# Patient Record
Sex: Female | Born: 1980 | Hispanic: Yes | Marital: Married | Smoking: Never smoker
Health system: Southern US, Community
[De-identification: ages and names within clinical notes are randomized; demographics above are authoritative.]

## PROBLEM LIST (undated history)

## (undated) DIAGNOSIS — F419 Anxiety disorder, unspecified: Secondary | ICD-10-CM

## (undated) DIAGNOSIS — T7840XA Allergy, unspecified, initial encounter: Secondary | ICD-10-CM

## (undated) DIAGNOSIS — R51 Headache: Secondary | ICD-10-CM

## (undated) DIAGNOSIS — R87619 Unspecified abnormal cytological findings in specimens from cervix uteri: Secondary | ICD-10-CM

## (undated) DIAGNOSIS — F329 Major depressive disorder, single episode, unspecified: Secondary | ICD-10-CM

## (undated) DIAGNOSIS — S3992XA Unspecified injury of lower back, initial encounter: Secondary | ICD-10-CM

## (undated) DIAGNOSIS — E162 Hypoglycemia, unspecified: Secondary | ICD-10-CM

## (undated) DIAGNOSIS — F32A Depression, unspecified: Secondary | ICD-10-CM

## (undated) DIAGNOSIS — S82891A Other fracture of right lower leg, initial encounter for closed fracture: Secondary | ICD-10-CM

## (undated) DIAGNOSIS — R002 Palpitations: Secondary | ICD-10-CM

## (undated) DIAGNOSIS — IMO0002 Reserved for concepts with insufficient information to code with codable children: Secondary | ICD-10-CM

## (undated) DIAGNOSIS — N83209 Unspecified ovarian cyst, unspecified side: Secondary | ICD-10-CM

## (undated) HISTORY — PX: TUBAL LIGATION: SHX77

## (undated) HISTORY — PX: LAPAROSCOPIC OVARIAN CYSTECTOMY: SUR786

## (undated) HISTORY — PX: ABSCESS DRAINAGE: SHX1119

## (undated) HISTORY — DX: Allergy, unspecified, initial encounter: T78.40XA

## (undated) HISTORY — PX: BREAST LUMPECTOMY: SHX2

---

## 2010-10-10 ENCOUNTER — Encounter (HOSPITAL_COMMUNITY): Payer: Self-pay | Admitting: *Deleted

## 2010-10-10 ENCOUNTER — Inpatient Hospital Stay (HOSPITAL_COMMUNITY): Payer: 59

## 2010-10-10 ENCOUNTER — Inpatient Hospital Stay (HOSPITAL_COMMUNITY)
Admission: AD | Admit: 2010-10-10 | Discharge: 2010-10-10 | Disposition: A | Discharge status: Home / Self Care | Payer: 59 | Source: Ambulatory Visit | Attending: Obstetrics and Gynecology | Admitting: Obstetrics and Gynecology

## 2010-10-10 DIAGNOSIS — R109 Unspecified abdominal pain: Secondary | ICD-10-CM

## 2010-10-10 HISTORY — DX: Palpitations: R00.2

## 2010-10-10 HISTORY — DX: Unspecified ovarian cyst, unspecified side: N83.209

## 2010-10-10 HISTORY — DX: Headache: R51

## 2010-10-10 HISTORY — DX: Hypoglycemia, unspecified: E16.2

## 2010-10-10 HISTORY — DX: Other fracture of right lower leg, initial encounter for closed fracture: S82.891A

## 2010-10-10 HISTORY — DX: Major depressive disorder, single episode, unspecified: F32.9

## 2010-10-10 HISTORY — DX: Reserved for concepts with insufficient information to code with codable children: IMO0002

## 2010-10-10 HISTORY — DX: Unspecified injury of lower back, initial encounter: S39.92XA

## 2010-10-10 HISTORY — DX: Unspecified abnormal cytological findings in specimens from cervix uteri: R87.619

## 2010-10-10 HISTORY — DX: Anxiety disorder, unspecified: F41.9

## 2010-10-10 HISTORY — DX: Depression, unspecified: F32.A

## 2010-10-10 LAB — URINALYSIS, ROUTINE W REFLEX MICROSCOPIC
Glucose, UA: NEGATIVE mg/dL
Leukocytes, UA: NEGATIVE
Nitrite: NEGATIVE
Protein, ur: NEGATIVE mg/dL
pH: 7 (ref 5.0–8.0)

## 2010-10-10 LAB — POCT PREGNANCY, URINE: Preg Test, Ur: NEGATIVE

## 2010-10-10 MED ORDER — IBUPROFEN 600 MG PO TABS: 600.0000 mg | ORAL_TABLET | Freq: Four times a day (QID) | ORAL | Status: AC | PRN

## 2010-10-10 NOTE — Progress Notes (Signed)
Pain started on Tues- RLQ, feels like abd is swelling,

## 2010-10-10 NOTE — ED Provider Notes (Signed)
Sydney Sullivan is a 30 y.o. [id number]  Chief Complaint  Patient presents with  . Abdominal Pain  She gives a two-day history of constant, sharp right suprapubic pain. She believes it is similar in quality to the pain she experienced when she had an ovarian cyst in the past. She is rating her pain 5/10 at present. She's had a BTL and 2 C-sections.  LMP 09/03/10. Denies menometrorrhagia, dysuria. Mild dysparunia.   Filed Vitals:   10/10/10 1231  BP: 111/65  Pulse: 73  Temp: 98.3 F (36.8 C)  Resp: 16   Exam:  General: Sydney N. WD pleasant in NAD  Abdomen: Soft, minimally tender to deep palpation right suprapubic region. No guarding or rebound. No masses noted.  Pelvic: NEFG except there is a buttonhole skin defect of right hymenal tissue. Vagina and cervix clean. Cervix mobile no tenderness elicited. Uterus NSS, retroverted. Adnexae without tenderness or masses.  Results for orders placed during the hospital encounter of 10/10/10 (from the past 24 hour(s))  URINALYSIS, ROUTINE W REFLEX MICROSCOPIC     Status: Abnormal   Collection Time   10/10/10 12:40 PM      Component Value Range   Color, Urine YELLOW  YELLOW    Appearance HAZY (*) CLEAR    Specific Gravity, Urine 1.020  1.005 - 1.030    pH 7.0  5.0 - 8.0    Glucose, UA NEGATIVE  NEGATIVE (mg/dL)   Hgb urine dipstick NEGATIVE  NEGATIVE    Bilirubin Urine NEGATIVE  NEGATIVE    Ketones, ur NEGATIVE  NEGATIVE (mg/dL)   Protein, ur NEGATIVE  NEGATIVE (mg/dL)   Urobilinogen, UA 0.2  0.0 - 1.0 (mg/dL)   Nitrite NEGATIVE  NEGATIVE    Leukocytes, UA NEGATIVE  NEGATIVE   POCT PREGNANCY, URINE     Status: Normal   Collection Time   10/10/10  1:02 PM      Component Value Range   Preg Test, Ur NEGATIVE    Pelvic US: unremarkable, sm CLC on rt  A/P Abdominal pain  - RX ibuprofen, keep pain diary. Refer to GYN Clinic for [facility] care.

## 2010-10-10 NOTE — ED Notes (Signed)
Plan discussed.  Waiting for [place]. Call light in reach. No c/o.

## 2010-10-10 NOTE — ED Notes (Signed)
Pt states late period is not unusual... Hx of irreg cycles.

## 2010-10-10 NOTE — Progress Notes (Signed)
Pt states she started having R lower abdominal pain on 8-14 that comes and goes. Pt Has a BTL with a cesarean section 10 years ago. No bleeding or discharge, nausea or vomiting.

## 2010-10-12 NOTE — ED Provider Notes (Signed)
Agree with above note.  Sydney Sullivan 10/12/2010 11:03 AM

## 2010-11-22 ENCOUNTER — Encounter: Payer: Self-pay | Admitting: Obstetrics and Gynecology

## 2010-11-22 ENCOUNTER — Ambulatory Visit (INDEPENDENT_AMBULATORY_CARE_PROVIDER_SITE_OTHER): Payer: No Typology Code available for payment source | Admitting: Obstetrics and Gynecology

## 2010-11-22 VITALS — BP 112/74 | HR 70 | Temp 98.2°F | Ht <= 58 in | Wt 122.8 lb

## 2010-11-22 DIAGNOSIS — R1011 Right upper quadrant pain: Secondary | ICD-10-CM

## 2010-11-22 LAB — LIPASE: Lipase: 11 U/L (ref 0–75)

## 2010-11-22 LAB — AMYLASE: Amylase: 80 U/L (ref 0–105)

## 2010-11-22 LAB — COMPREHENSIVE METABOLIC PANEL
ALT: 15 U/L (ref 0–35)
AST: 18 U/L (ref 0–37)
Alkaline Phosphatase: 53 U/L (ref 39–117)
Calcium: 9.7 mg/dL (ref 8.4–10.5)
Chloride: 102 mEq/L (ref 96–112)
Creat: 0.56 mg/dL (ref 0.50–1.10)

## 2010-11-22 LAB — CBC
Hemoglobin: 12.9 g/dL (ref 12.0–15.0)
MCHC: 31.3 g/dL (ref 30.0–36.0)
Platelets: 195 10*3/uL (ref 150–400)
RBC: 4.73 MIL/uL (ref 3.87–5.11)

## 2010-11-26 ENCOUNTER — Ambulatory Visit (HOSPITAL_COMMUNITY): Payer: No Typology Code available for payment source

## 2010-11-27 NOTE — Progress Notes (Signed)
S: Pt presents today c/o Rt side pain. She states the pain comes and goes and is located mostly in her Rt side and Rt upper quad. She denies vag dc, bleeding, fever, or any other sx at this time. O: VSS afebrile A&Ox3 in NAD Abd soft, mild tenderness in RUQ. No rebound or guarding. No tenderness to lower quads. NL external genitalia. Bimanual exam reveals uterus to be NL size and shape with no adnexal masses. No dc or bleeding noted on exam. A/P: RUQ pain: Will check CBC, amylase, lipase, LFTs, and scheduled abd ultrasound to r/o cholecystitis. Discussed with pt at length. She will return to the GYN clinic following her [place]. Discussed diet, activity, risks, and precautions. All information was obtained and explained via interpreter.  Clinton Gallant. Zeda Gangwer III, DrHSc, MPAS, PA-C

## 2010-12-06 ENCOUNTER — Ambulatory Visit (HOSPITAL_COMMUNITY)
Admission: RE | Admit: 2010-12-06 | Discharge: 2010-12-06 | Disposition: A | Discharge status: Home / Self Care | Payer: No Typology Code available for payment source | Source: Ambulatory Visit | Attending: Obstetrics and Gynecology | Admitting: Obstetrics and Gynecology

## 2010-12-06 DIAGNOSIS — R1011 Right upper quadrant pain: Secondary | ICD-10-CM | POA: Insufficient documentation

## 2010-12-20 ENCOUNTER — Encounter: Payer: Self-pay | Admitting: Obstetrics and Gynecology

## 2010-12-20 ENCOUNTER — Ambulatory Visit (INDEPENDENT_AMBULATORY_CARE_PROVIDER_SITE_OTHER): Payer: No Typology Code available for payment source | Admitting: Obstetrics and Gynecology

## 2010-12-20 VITALS — BP 121/73 | HR 80 | Temp 97.7°F | Ht <= 58 in | Wt 122.9 lb

## 2010-12-20 DIAGNOSIS — L658 Other specified nonscarring hair loss: Secondary | ICD-10-CM

## 2010-12-20 LAB — TSH: TSH: 0.75 u[IU]/mL (ref 0.350–4.500)

## 2010-12-20 NOTE — Progress Notes (Signed)
Patient is a 30 year old Spanish-speaking Hispanic female gravida 2 para 200 to missing 2 weeks ago because of upper right quadrant pain that came frequently lasted for a few minutes and then went away she was tested for liver function tests ultrasound and CT for possible cholecystitis and amylase and lipase all of her testing was normal abdominal CT as well as a pelvic ultrasound. I've told her to start stool softeners and she only goes every 3 days started 4 times a day and will she's going more than a couple times a day to cut it back to 3 to one time a day micturition can lead 8 glasses of fluid each day see that removed recently as the pain if it doesn't we'll refer her to a gastroenterologist.  Impression: Obstipation possible IBS. Recent hair loss.

## 2010-12-20 NOTE — Progress Notes (Signed)
Pt already has had flu vaccine

## 2011-11-17 ENCOUNTER — Ambulatory Visit (INDEPENDENT_AMBULATORY_CARE_PROVIDER_SITE_OTHER): Payer: 59 | Admitting: Obstetrics & Gynecology

## 2011-11-17 ENCOUNTER — Encounter: Payer: Self-pay | Admitting: Obstetrics & Gynecology

## 2011-11-17 VITALS — BP 110/70 | HR 78 | Temp 98.6°F | Ht <= 58 in | Wt 138.0 lb

## 2011-11-17 DIAGNOSIS — N898 Other specified noninflammatory disorders of vagina: Secondary | ICD-10-CM

## 2011-11-17 DIAGNOSIS — Z0000 Encounter for general adult medical examination without abnormal findings: Secondary | ICD-10-CM

## 2011-11-17 DIAGNOSIS — Z01419 Encounter for gynecological examination (general) (routine) without abnormal findings: Secondary | ICD-10-CM

## 2011-11-17 NOTE — Progress Notes (Signed)
Subjective:    Sydney Sullivan is a 31 y.o. female who presents for an annual exam. She continues to have her RLQ pain. It is unchanged and daily for the last 2-3 years. It is worse and wakes her up at night when she changes position during sleep. The patient is sexually active. GYN screening history: last pap: was normal in [place].The patient wears seatbelts: yes. The patient participates in regular exercise: yes. Has the patient ever been transfused or tattooed?: no. The patient reports that there is not domestic violence in her life.   Menstrual History: OB History    Grav Para Term Preterm Abortions TAB SAB Ect Mult Living   2 2 2  0 0 0 0 0 0 2      Menarche age: 72 Patient's last menstrual period was 09/29/2011.    The following portions of the patient's history were reviewed and updated as appropriate: allergies, current medications, past family history, past medical history, past social history, past surgical history and problem list.  Review of Systems A comprehensive review of systems was negative. She is married, homemaker. She reports that she has a BM daily.   Objective:    BP 110/70  Pulse 78  Temp 98.6 F (37 C) (Oral)  Ht 4\' 10"  (1.473 m)  Wt 138 lb (62.596 kg)  BMI 28.84 kg/m2  LMP 09/29/2011  General Appearance:    Alert, cooperative, no distress, appears stated age  Head:    Normocephalic, without obvious abnormality, atraumatic  Eyes:    PERRL, conjunctiva/corneas clear, EOM's intact, fundi    benign, both eyes  Ears:    Normal TM's and external ear canals, both ears  Nose:   Nares normal, septum midline, mucosa normal, no drainage    or sinus tenderness  Throat:   Lips, mucosa, and tongue normal; teeth and gums normal  Neck:   Supple, symmetrical, trachea midline, no adenopathy;    thyroid:  no enlargement/tenderness/nodules; no carotid   bruit or JVD  Back:     Symmetric, no curvature, ROM normal, no CVA tenderness  Lungs:     Clear to  auscultation bilaterally, respirations unlabored  Chest Wall:    No tenderness or deformity   Heart:    Regular rate and rhythm, S1 and S2 normal, no murmur, rub   or gallop  Breast Exam:    No tenderness, masses, or nipple abnormality  Abdomen:     Soft, non-tender, bowel sounds active all four quadrants,    no masses, no organomegaly  Genitalia:    Normal female without lesion, discharge or tenderness, normal discharge, normal cervix, no CMT. NSSA, NT, no adnexal masses. I did feel a hard BM in her vault.     Extremities:   Extremities normal, atraumatic, no cyanosis or edema  Pulses:   2+ and symmetric all extremities  Skin:   Skin color, texture, turgor normal, no rashes or lesions  Lymph nodes:   Cervical, supraclavicular, and axillary nodes normal  Neurologic:   CNII-XII intact, normal strength, sensation and reflexes    throughout  .    Assessment:    Healthy female exam.  Long standing RLQ pain. Not gyn etiology. I am unsure what the cause is. I have offered her a general surgery consult.   Plan:     Pap smear. Wet prep.

## 2011-11-18 LAB — WET PREP, GENITAL
Clue Cells Wet Prep HPF POC: NONE SEEN
Trich, Wet Prep: NONE SEEN
Yeast Wet Prep HPF POC: NONE SEEN

## 2012-04-10 ENCOUNTER — Other Ambulatory Visit: Payer: Self-pay

## 2012-12-30 ENCOUNTER — Other Ambulatory Visit: Payer: Self-pay

## 2013-01-24 ENCOUNTER — Ambulatory Visit: Payer: 59 | Admitting: Medical

## 2013-08-04 ENCOUNTER — Ambulatory Visit (INDEPENDENT_AMBULATORY_CARE_PROVIDER_SITE_OTHER): Payer: No Typology Code available for payment source | Admitting: Obstetrics & Gynecology

## 2013-08-04 ENCOUNTER — Encounter: Payer: Self-pay | Admitting: Obstetrics & Gynecology

## 2013-08-04 VITALS — BP 144/79 | HR 93 | Ht <= 58 in | Wt 133.9 lb

## 2013-08-04 DIAGNOSIS — N939 Abnormal uterine and vaginal bleeding, unspecified: Secondary | ICD-10-CM

## 2013-08-04 DIAGNOSIS — N926 Irregular menstruation, unspecified: Secondary | ICD-10-CM

## 2013-08-04 DIAGNOSIS — N949 Unspecified condition associated with female genital organs and menstrual cycle: Secondary | ICD-10-CM

## 2013-08-04 DIAGNOSIS — G8929 Other chronic pain: Secondary | ICD-10-CM

## 2013-08-04 DIAGNOSIS — R102 Pelvic and perineal pain: Principal | ICD-10-CM

## 2013-08-04 LAB — CBC
HCT: 39.1 % (ref 36.0–46.0)
Hemoglobin: 12.9 g/dL (ref 12.0–15.0)
MCH: 27.4 pg (ref 26.0–34.0)
MCHC: 33 g/dL (ref 30.0–36.0)
MCV: 83.2 fL (ref 78.0–100.0)
PLATELETS: 249 10*3/uL (ref 150–400)
RBC: 4.7 MIL/uL (ref 3.87–5.11)
RDW: 14.2 % (ref 11.5–15.5)
WBC: 8.7 10*3/uL (ref 4.0–10.5)

## 2013-08-04 MED ORDER — NAPROXEN 500 MG PO TABS: 500.0000 mg | ORAL_TABLET | Freq: Two times a day (BID) | ORAL | Status: DC

## 2013-08-04 NOTE — Progress Notes (Signed)
Patient reports that she had a lot of bleeding back in September and passed out. EMS was called and she refused to go to hospital. Since that time she is experiencing very bad cramps with and without her period.

## 2013-08-04 NOTE — Progress Notes (Signed)
   CLINIC ENCOUNTER NOTE  History:  33 y.o. [id number] here today for evaluation of pelvic pain and irregular bleeding. Patient is Spanish-speaking only, Spanish interpreter present for this encounter. Had bleeding episode in September, did not get evaluated.  Pain is not related to her menses; happens during and also not during menses.  Had BTL in 2001, pain feels like "she is having a baby". Also reports dyspareunia. Also notes abnormal vaginal discharge.  The following portions of the patient's history were reviewed and updated as appropriate: allergies, current medications, past family history, past medical history, past social history, past surgical history and problem list.  Review of Systems:  Pertinent items are noted in HPI.  Objective:  Physical Exam BP 144/79  Pulse 93  Ht 4\' 10"  (1.473 m)  Wt 133 lb 14.4 oz (60.737 kg)  BMI 27.99 kg/m2  LMP 07/22/2013 Gen: NAD Abd: Soft, nontender and nondistended Pelvic: Normal appearing external genitalia; normal appearing vaginal mucosa and cervix.  Normal discharge.  Small uterus, no other palpable masses, mild uterine and left adnexal tenderness. Pelvic cultures done.   Assessment & Plan:  Will get pelvic ultrasound; AUB labs checked NSAIDs recommended - Naproxen prescribed Will follow up results and manage accordingly    Verita Schneiders, MD, FACOG Attending [place], [facility]

## 2013-08-04 NOTE — Patient Instructions (Signed)
Return to clinic for any scheduled appointments or for any gynecologic concerns as needed.   

## 2013-08-05 LAB — TESTOSTERONE, FREE, TOTAL, SHBG
Sex Hormone Binding: 40 nmol/L (ref 18–114)
Testosterone, Free: 10.6 pg/mL — ABNORMAL HIGH (ref 0.6–6.8)
Testosterone-% Free: 1.6 % (ref 0.4–2.4)
Testosterone: 66 ng/dL (ref 10–70)

## 2013-08-05 LAB — GC/CHLAMYDIA PROBE AMP
CT PROBE, AMP APTIMA: NEGATIVE
GC PROBE AMP APTIMA: NEGATIVE

## 2013-08-05 LAB — WET PREP, GENITAL
CLUE CELLS WET PREP: NONE SEEN
TRICH WET PREP: NONE SEEN
WBC, Wet Prep HPF POC: NONE SEEN
Yeast Wet Prep HPF POC: NONE SEEN

## 2013-08-05 LAB — TSH: TSH: 0.595 u[IU]/mL (ref 0.350–4.500)

## 2013-08-05 LAB — FOLLICLE STIMULATING HORMONE: FSH: 6.2 m[IU]/mL

## 2013-08-08 ENCOUNTER — Ambulatory Visit (HOSPITAL_COMMUNITY)
Admission: RE | Admit: 2013-08-08 | Discharge: 2013-08-08 | Disposition: A | Discharge status: Home / Self Care | Payer: No Typology Code available for payment source | Source: Ambulatory Visit | Attending: Obstetrics & Gynecology | Admitting: Obstetrics & Gynecology

## 2013-08-08 DIAGNOSIS — G8929 Other chronic pain: Secondary | ICD-10-CM

## 2013-08-08 DIAGNOSIS — N938 Other specified abnormal uterine and vaginal bleeding: Secondary | ICD-10-CM | POA: Insufficient documentation

## 2013-08-08 DIAGNOSIS — N939 Abnormal uterine and vaginal bleeding, unspecified: Secondary | ICD-10-CM

## 2013-08-08 DIAGNOSIS — R102 Pelvic and perineal pain: Secondary | ICD-10-CM

## 2013-08-08 DIAGNOSIS — N949 Unspecified condition associated with female genital organs and menstrual cycle: Secondary | ICD-10-CM | POA: Insufficient documentation

## 2013-08-08 DIAGNOSIS — N925 Other specified irregular menstruation: Secondary | ICD-10-CM | POA: Insufficient documentation

## 2013-08-10 ENCOUNTER — Telehealth: Payer: Self-pay | Admitting: *Deleted

## 2013-08-10 ENCOUNTER — Encounter: Payer: Self-pay | Admitting: *Deleted

## 2013-08-10 NOTE — Telephone Encounter (Signed)
Attempted to contact patient, both numbers are invalid, will send certified letter.

## 2013-08-10 NOTE — Telephone Encounter (Signed)
Message copied by Sue Lush on Wed Aug 10, 2013  4:13 PM ------      Message from: Verita Schneiders A      Created: Tue Aug 09, 2013 12:47 PM       Normal ultrasound. If bleeding continues, may need endometrial biopsy. She needs appointment to discuss long term management options. Please call to inform patient of results and recommendations. Patient is Spanish-speaking only. ------

## 2013-09-08 ENCOUNTER — Encounter: Payer: No Typology Code available for payment source | Admitting: Advanced Practice Midwife

## 2013-09-15 ENCOUNTER — Ambulatory Visit (INDEPENDENT_AMBULATORY_CARE_PROVIDER_SITE_OTHER): Payer: No Typology Code available for payment source | Admitting: Obstetrics & Gynecology

## 2013-09-15 ENCOUNTER — Encounter: Payer: Self-pay | Admitting: Obstetrics & Gynecology

## 2013-09-15 VITALS — BP 118/83 | HR 79 | Temp 98.2°F | Wt 135.9 lb

## 2013-09-15 DIAGNOSIS — N946 Dysmenorrhea, unspecified: Secondary | ICD-10-CM

## 2013-09-15 DIAGNOSIS — R102 Pelvic and perineal pain: Secondary | ICD-10-CM

## 2013-09-15 DIAGNOSIS — N949 Unspecified condition associated with female genital organs and menstrual cycle: Secondary | ICD-10-CM

## 2013-09-15 MED ORDER — MEDROXYPROGESTERONE ACETATE 5 MG PO TABS: 5.0000 mg | ORAL_TABLET | Freq: Every day | ORAL | Status: DC

## 2013-09-15 NOTE — Progress Notes (Signed)
Subjective:     Patient ID: Sydney Sullivan, female   DOB: 15-Feb-1981, 33 y.o.   MRN: [id number]  HPIreturns for result of [place] and to discuss management of pelvic pain and dysmenorrhea.   Review of Systems  Constitutional: Negative.   Gastrointestinal: Positive for nausea and vomiting.  Genitourinary: Positive for menstrual problem (dysmenorrhea) and pelvic pain.       Objective:   Physical Exam  Constitutional: She is oriented to person, place, and time. She appears well-developed. No distress.  Pulmonary/Chest: Effort normal. No respiratory distress.  Neurological: She is alert and oriented to person, place, and time.  Psychiatric: She has a normal mood and affect. Her behavior is normal.      CLINICAL DATA: Abnormal uterine bleeding. Pelvic pain.  EXAM:  TRANSABDOMINAL AND TRANSVAGINAL ULTRASOUND OF PELVIS  TECHNIQUE:  Both transabdominal and transvaginal ultrasound examinations of the  pelvis were performed. Transabdominal technique was performed for  global imaging of the pelvis including uterus, ovaries, adnexal  regions, and pelvic cul-de-sac. It was necessary to proceed with  endovaginal exam following the transabdominal exam to visualize the  uterus and endometrium.  COMPARISON: None  FINDINGS:  Uterus  Measurements: 7.1 x 4.9 x 5.2 cm. No fibroids or other mass  visualized.  Endometrium  Thickness: 17.5 mm. No focal abnormality visualized.  Right ovary  Measurements: 3.4 x 1.9 x 2.5 cm. Normal appearance/no adnexal mass.  Left ovary  Measurements: 2.3 x 1.8 x 1.5 cm. Normal appearance/no adnexal  mass.  Other findings  Small amount of free fluid noted in the pelvis.  IMPRESSION:  1. No focal abnormality identified. The endometrium measures 17.5 mm  in thickness. If bleeding remains unresponsive to hormonal or  medical therapy, focal lesion work-up with sonohysterogram should be  considered. Endometrial biopsy should also be considered in   pre-menopausal patients at high risk for endometrial carcinoma.  (Ref: Radiological Reasoning: Algorithmic Workup of Abnormal Vaginal  Bleeding with Endovaginal Sonography and Sonohysterography. AJR  2008; 144:[id number])  2. Small amount of free fluid is noted within the pelvis.  Electronically Signed  By: Kerby Moors M.D.  On: 08/08/2013 16:05   Assessment:     Pelvic pain     Plan:     Provera 5 mg daily, RTC 3 months, might be candidate for Dx L/S   Woodroe Mode, MD 09/15/2013

## 2013-09-15 NOTE — Patient Instructions (Signed)
Dolor pélvico  °(Pelvic Pain) ° Las causa del dolor pélvico en la mujer pueden ser muchas y pueden tener su origen en diferentes lugares. El dolor pélvico es el que aparece en la mitad inferior del abdomen y entre las caderas. Puede aparecer durante en un período corto de tiempo (agudo)o puede ser recurrente (crónico). Esta afección puede estar relacionada con trastornos que afectan a los órganos reproductivos femeninos (ginecológica), pero también puede deberse a problemas en la vejiga, cálculos renales, complicaciones intestinales, o problemas musculares o esqueléticos. Es importante solicitar ayuda de inmediato, sobre todo si ha sido intenso, agudo, o ha aparecido de manera súbita como un dolor inusual. También es importante obtener ayuda de inmediato, ya que algunos tipos de dolor pélvico puede poner en peligro la vida.  °CAUSAS  °A continuación veremos algunas de las causas del dolor pélvico. Las causas pueden clasificarse de diferentes modos.  °· Ginecológica. °¨ Enfermedad inflamatoria pélvica. °¨ Infecciones de transmisión sexual. °¨ Quiste de ovario o torsión de un ligamento ovárico ( torsión ovárica). °¨ La membrana que recubre internamente al útero desarrollándose fuera del útero (endometriosis). °¨ Fibromas, quistes o tumores. °¨ Ovulación. °· Embarazo. °¨ Embarazo fuera del útero (embarazo ectópico). °¨ Aborto espontáneo. °¨ Trabajo de parto. °¨ Desprendimiento de la placenta o ruptura del útero. °· Infecciones. °¨ Infección uterina (endometritis). °¨ Infección de la vejiga. °¨ Diverticulitis. °¨ Aborto relacionado con una infección uterina (aborto séptico). °· Vejiga. °¨ Inflamación de la vejiga (cistitis). °¨ Cálculos renales. °· Gastrointenstinal. °¨ Estreñimiento. °¨ Diverticulitis. °· Neurológico. °¨ Traumatismos. °¨ Sentir dolor pélvico debido a causas mentales o emocionales (psicosomático). °· Tumores en el intestino o en la pelvis. °EVALUACIÓN  °El médico hará una historia clínica detallada  según sus síntomas. Incluirá los cambios recientes en su salud, una cuidadosa historia ginecológica de sus periodos (menstruaciones) y una historia de su actividad sexual. Los antecedentes familiares y la historia clínica también son importantes. Su médico podrá indicar un examen pélvico. El examen pélvico ayudará a identificar la ubicación y la gravedad del dolor. También ayudará a evaluar los órganos que pueden estar involucrados. . Para identificar la causa del dolor pélvico y tratarlo adecuadamente, el médico puede indicar estudios. Estas pruebas pueden ser:  °· Test de embarazo. °· Ecografía pélvica. °· Radiografía del abdomen. °· Un análisis de orina o la evaluación de la secreción vaginal. °· Análisis de sangre. °INSTRUCCIONES PARA EL CUIDADO EN EL HOGAR  °· Solo tome medicamentos de venta libre o recetados para el dolor, malestar o fiebre, según las indicaciones del médico.   °· Haga reposo según las indicaciones del médico.   °· Consuma una dieta balanceada.   °· Beba gran cantidad de líquido para mantener la orina de tono claro o amarillo pálido.   °· Evite las relaciones sexuales, si le producen dolor.   °· Aplique compresas calientes o frías en la zona baja del abdomen según cual le calme el dolor.   °· Evite las situaciones estresantes.   °· Lleve un registro del dolor pélvico. Anote cuándo comenzó, dónde se localiza el dolor y si hay cosas que parecen estar asociadas con el dolor, como algún alimento o su ciclo menstrual. °· Concurra a las visitas de control con el médico, según las indicaciones.   °SOLICITE ATENCIÓN MÉDICA SI:  °· Los medicamentos no le calman el dolor. °· Tiene flujo vaginal anormal. °SOLICITE ATENCIÓN MÉDICA DE INMEDIATO SI:  °· Tiene un sangrado abundante por la vagina.   °· El dolor pélvico aumenta.   °· Se siente mareada o sufre un desmayo.   °·   Siente escalofríos.   °· Siente dolor intenso al orinar u observa sangre en la orina.   °· Tiene diarrea o vómitos que no puede  controlar.   °· Tiene fiebre o síntomas que persisten durante más de 3 días. °· Tiene fiebre y los síntomas empeoran.   °· Ha sido abusada física o sexualmente.   °ASEGÚRESE DE QUE:  °· Comprende estas instrucciones. °· Controlará su enfermedad. °· Solicitará ayuda de inmediato si no mejora o si empeora. °Document Released: 05/09/2008 Document Revised: 06/27/2013 °ExitCare® Patient Information ©2015 [employer]. This information is not intended to replace advice given to you by your health care provider. Make sure you discuss any questions you have with your health care provider. ° °

## 2013-10-07 ENCOUNTER — Encounter: Payer: Self-pay | Admitting: General Practice

## 2013-10-14 ENCOUNTER — Encounter: Payer: Self-pay | Admitting: General Practice

## 2013-12-26 ENCOUNTER — Encounter: Payer: Self-pay | Admitting: Obstetrics & Gynecology

## 2014-07-25 ENCOUNTER — Emergency Department (HOSPITAL_COMMUNITY)
Admission: EM | Admit: 2014-07-25 | Discharge: 2014-07-25 | Disposition: A | Discharge status: Home / Self Care | Payer: Commercial Indemnity | Attending: Emergency Medicine | Admitting: Emergency Medicine

## 2014-07-25 ENCOUNTER — Emergency Department (HOSPITAL_COMMUNITY): Payer: Commercial Indemnity

## 2014-07-25 ENCOUNTER — Encounter (HOSPITAL_COMMUNITY): Payer: Self-pay | Admitting: Physical Medicine and Rehabilitation

## 2014-07-25 DIAGNOSIS — Z8781 Personal history of (healed) traumatic fracture: Secondary | ICD-10-CM | POA: Insufficient documentation

## 2014-07-25 DIAGNOSIS — Z8639 Personal history of other endocrine, nutritional and metabolic disease: Secondary | ICD-10-CM | POA: Insufficient documentation

## 2014-07-25 DIAGNOSIS — R55 Syncope and collapse: Secondary | ICD-10-CM | POA: Insufficient documentation

## 2014-07-25 DIAGNOSIS — J4521 Mild intermittent asthma with (acute) exacerbation: Secondary | ICD-10-CM | POA: Insufficient documentation

## 2014-07-25 DIAGNOSIS — R0602 Shortness of breath: Secondary | ICD-10-CM | POA: Diagnosis present

## 2014-07-25 DIAGNOSIS — M6281 Muscle weakness (generalized): Secondary | ICD-10-CM | POA: Insufficient documentation

## 2014-07-25 DIAGNOSIS — Z793 Long term (current) use of hormonal contraceptives: Secondary | ICD-10-CM | POA: Diagnosis not present

## 2014-07-25 DIAGNOSIS — R42 Dizziness and giddiness: Secondary | ICD-10-CM | POA: Diagnosis not present

## 2014-07-25 DIAGNOSIS — R51 Headache: Secondary | ICD-10-CM | POA: Insufficient documentation

## 2014-07-25 DIAGNOSIS — Z7952 Long term (current) use of systemic steroids: Secondary | ICD-10-CM | POA: Diagnosis not present

## 2014-07-25 DIAGNOSIS — Z8659 Personal history of other mental and behavioral disorders: Secondary | ICD-10-CM | POA: Insufficient documentation

## 2014-07-25 DIAGNOSIS — Z87828 Personal history of other (healed) physical injury and trauma: Secondary | ICD-10-CM | POA: Diagnosis not present

## 2014-07-25 DIAGNOSIS — Z8742 Personal history of other diseases of the female genital tract: Secondary | ICD-10-CM | POA: Diagnosis not present

## 2014-07-25 DIAGNOSIS — J40 Bronchitis, not specified as acute or chronic: Secondary | ICD-10-CM

## 2014-07-25 DIAGNOSIS — R531 Weakness: Secondary | ICD-10-CM

## 2014-07-25 DIAGNOSIS — J4520 Mild intermittent asthma, uncomplicated: Secondary | ICD-10-CM

## 2014-07-25 LAB — CBC WITH DIFFERENTIAL/PLATELET
BASOS ABS: 0.1 10*3/uL (ref 0.0–0.1)
Basophils Relative: 0 % (ref 0–1)
EOS PCT: 0 % (ref 0–5)
Eosinophils Absolute: 0 10*3/uL (ref 0.0–0.7)
HEMATOCRIT: 39.1 % (ref 36.0–46.0)
HEMOGLOBIN: 12.6 g/dL (ref 12.0–15.0)
Lymphocytes Relative: 27 % (ref 12–46)
Lymphs Abs: 3.4 10*3/uL (ref 0.7–4.0)
MCH: 26.8 pg (ref 26.0–34.0)
MCHC: 32.2 g/dL (ref 30.0–36.0)
MCV: 83 fL (ref 78.0–100.0)
MONO ABS: 0.8 10*3/uL (ref 0.1–1.0)
MONOS PCT: 7 % (ref 3–12)
NEUTROS ABS: 8.2 10*3/uL — AB (ref 1.7–7.7)
Neutrophils Relative %: 66 % (ref 43–77)
Platelets: 349 10*3/uL (ref 150–400)
RBC: 4.71 MIL/uL (ref 3.87–5.11)
RDW: 14 % (ref 11.5–15.5)
WBC: 12.5 10*3/uL — ABNORMAL HIGH (ref 4.0–10.5)

## 2014-07-25 LAB — COMPREHENSIVE METABOLIC PANEL
ALK PHOS: 64 U/L (ref 38–126)
ALT: 21 U/L (ref 14–54)
ANION GAP: 10 (ref 5–15)
AST: 20 U/L (ref 15–41)
Albumin: 3.8 g/dL (ref 3.5–5.0)
BILIRUBIN TOTAL: 0.4 mg/dL (ref 0.3–1.2)
BUN: 12 mg/dL (ref 6–20)
CO2: 26 mmol/L (ref 22–32)
Calcium: 9.3 mg/dL (ref 8.9–10.3)
Chloride: 102 mmol/L (ref 101–111)
Creatinine, Ser: 0.73 mg/dL (ref 0.44–1.00)
GFR calc Af Amer: >60 mL/min (ref >60)
GFR calc non Af Amer: >60 mL/min (ref >60)
Glucose, Bld: 105 mg/dL — ABNORMAL HIGH (ref 65–99)
POTASSIUM: 3.7 mmol/L (ref 3.5–5.1)
Sodium: 138 mmol/L (ref 135–145)
TOTAL PROTEIN: 7.4 g/dL (ref 6.5–8.1)

## 2014-07-25 LAB — I-STAT TROPONIN, ED: TROPONIN I, POC: 0.07 ng/mL (ref 0.00–0.08)

## 2014-07-25 LAB — D-DIMER, QUANTITATIVE: D-Dimer, Quant: 0.38 ug/mL-FEU (ref 0.00–0.48)

## 2014-07-25 MED ORDER — METHYLPREDNISOLONE SODIUM SUCC 125 MG IJ SOLR
125.0000 mg | Freq: Once | INTRAMUSCULAR | Status: AC
  Administered 2014-07-25: 125 mg via INTRAVENOUS
  Filled 2014-07-25: qty 2

## 2014-07-25 MED ORDER — HYDROCODONE-ACETAMINOPHEN 5-325 MG PO TABS
1.0000 | ORAL_TABLET | Freq: Once | ORAL | Status: AC
  Administered 2014-07-25: 1 via ORAL
  Filled 2014-07-25: qty 1

## 2014-07-25 MED ORDER — SODIUM CHLORIDE 0.9 % IV BOLUS (SEPSIS)
1000.0000 mL | Freq: Once | INTRAVENOUS | Status: AC
  Administered 2014-07-25: 1000 mL via INTRAVENOUS

## 2014-07-25 NOTE — ED Notes (Signed)
Pt presents to department for evaluation of midsternal chest pain, SOB, and headache. Was recently diagnosed with bronchitis, placed on antibiotic, pt thinks this could be reaction to medication. Respirations unlabored. NAD.

## 2014-07-25 NOTE — Discharge Instructions (Signed)
Asma (Asthma) El asma es una enfermedad recurrente en la que las vas respiratorias se estrechan y [place]. Puede causar dificultad para respirar. Provoca tos, sibilancias y sensacin de falta de aire. Los episodios de asma, tambin llamados crisis de asma, pueden ser leves o potencialmente mortales. El asma no puede curarse, pero los [employer] y los cambios en el estilo de vida lo ayudarn a Aeronautical engineer enfermedad. CAUSAS Se cree que la causa del asma son factores hereditarios (genticos) y la exposicin a factores ambientales; sin embargo, su causa exacta se desconoce. El asma generalmente es desencadenada por alrgenos, infecciones en los pulmones o sustancias irritantes que se encuentran en el aire. Los desencadenantes del asma son diferentes para cada persona. Los factores desencadenantes comunes incluyen:   Caspa de los [place].  caros del polvo.  [place].  El polen de los rboles o el csped.  [place].  Humo.  Sustancias contaminantes como el polvo, limpiadores del hogar, sprays para el cabello, aerosoles, vapores de pintura, sustancias qumicas fuertes u olores intensos.  El [place] fro, los cambios de Scientist, forensic y el viento (que aumenta la cantidad de moho y polen en el aire).  Emociones intensas, como llorar o rer [employer].  Estrs.  Ciertos medicamentos (como la aspirina) o algunos frmacos (como los betabloqueantes).  Los sulfitos que contienen los alimentos y las bebidas. Los alimentos y bebidas que pueden contener sulfitos son las frutas desecadas, las papas fritas y los vinos espumantes.  Enfermedades infecciosas o inflamatorias, como la gripe, el resfro o la inflamacin de las membranas nasales (rinitis).  El reflujo gastroesofgico (ERGE).  Los ejercicios o actividades extenuantes. SNTOMAS Los sntomas pueden ocurrir inmediatamente despus de que se desencadena el asma o muchas horas ms tarde. [place] sntomas son:  Sibilancias.  Tos excesiva  durante la noche o temprano por la maana.  Tos frecuente o intensa durante un resfro comn.  Opresin en el pecho.  Falta de aire. DIAGNSTICO  El diagnstico se realiza mediante la revisin de su historia clnica y de un examen fsico. Es posible que le indiquen algunos estudios. Estos pueden incluir:  Estudios de la funcin pulmonar. Estas pruebas indican cunto aire inhala y exhala.  Pruebas de alergia.  Estudios de diagnstico por imgenes, como radiografas. TRATAMIENTO  El asma no puede curarse, pero puede controlarse. El Tax inspector identificar y Product/process development scientist los factores desencadenantes del asma. Tambin incluye medicamentos. Hay dos tipos de medicamentos utilizados en el tratamiento para el asma:   Medicamentos de control del asma. Impiden que aparezcan los sntomas. Generalmente se [employer].  Medicamentos de [place] o de rescate. Alivian los sntomas rpidamente. Se utilizan cuando es necesario y proporcionan alivio a Control and instrumentation engineer. El mdico lo ayudar a Animal nutritionist de accin para el asma, que es una planificacin por escrito para el control y el tratamiento de las crisis de asma. Incluye una lista de los factores desencadenantes y el modo en que pueden evitarse. Tambin incluye informacin acerca del momento en que se deben [employer] y cundo se debe cambiar la dosis. Un plan de accin tambin incluye el uso de un dispositivo llamado espirmetro. El espirmetro es un dispositivo que mide el funcionamiento de los pulmones. Lo ayuda a controlar la enfermedad. [place] los medicamentos solamente como se lo haya indicado el mdico. Hable con el mdico si tiene preguntas acerca de cmo o cundo tomar los medicamentos.  Use un espirmetro de acuerdo con las indicaciones  del mdico. Anote y lleve un registro de los [place].  [place]ca y [employer] de accin para ayudar a minimizar o detener una crisis de  asma sin necesidad de buscar atencin mdica.  Controle el ambiente de su hogar de la siguiente manera para prevenir las crisis de asma:  No fume. Evite la exposicin al humo de otros fumadores.  Cambie regularmente el filtro de la calefaccin y del aire acondicionado.  Limite el uso de hogares o estufas a lea.  Elimine las plagas (como cucarachas, ratones) y sus excrementos.  Elimine las plantas si observa moho en ellas.  Limpie habitualmente los pisos y el polvo. Utilice productos sin perfume.  Intente que otra persona pase la aspiradora con regularidad. Permanezca fuera de las habitaciones mientras son aspiradas y por algn tiempo despus. Si usted pasa la [place], use una mscara para polvo de las que se consiguen en la [place], una bolsa de aspiradora de doble capa o microfiltro o una aspiradora con un filtro HEPA.  Reemplace las alfombras por pisos de [place], baldosas o vinilo. Las alfombras pueden retener la caspa de los animales y [place].  Use almohadas, mantas y cubre colchones antialrgicos.  [place] sbanas y las mantas todas las semanas con agua caliente y squelas con aire caliente.  Use mantas de polister o algodn.  Limpie baos y cocinas con lavandina. Si fuera posible, pdale a alguien que vuelva a pintar las paredes de estas habitaciones con Ardelia Mems pintura resistente a los hongos. Aljese de las habitaciones que se estn limpiando y pintando.  Lvese las manos con frecuencia. SOLICITE ATENCIN MDICA SI:   Respira con dificultad an cuando toma el medicamento para prevenir las crisis.  La mucosidad coloreada que expectora cuando tose (esputo) es ms espesa que lo habitual.  Su esputo cambia de un color claro o blanco a un color amarillo, verde, gris o sanguinolento.  Tiene trastornos ocasionados por el medicamento que est tomando (como urticaria, picazn, hinchazn o dificultades respiratorias).  Necesita un medicamento aliviador ms de 2 o 3 veces  por semana.  Su flujo espiratorio mximo se mantiene entre el 50% y el 79% de su Pharmacist, hospital personal, despus de seguir el plan de accin durante 1hora.  Tiene fiebre. SOLICITE ATENCIN MDICA DE INMEDIATO SI:   Usted parece empeorar y no responde al tratamiento durante una crisis de asma.  Le falta el aire, incluso en reposo.  Le falta el aire an cuando hace muy poca actividad fsica.  Tiene dificultad para comer, beber o hablar debido a los sntomas del asma.  Siente dolor en el pecho.  Se le acelera la frecuencia cardaca.  Tiene los labios o las uas de tono [place].  Siente que est por desvanecerse, est mareado o se desmaya.  Su flujo mximo es Garment/textile technologist del 50% del Pharmacist, hospital personal. ASEGRESE DE QUE:   Comprende estas instrucciones.  Controlar su afeccin.  Recibir ayuda de inmediato si no mejora o si empeora. Document Released: 02/10/2005 Document Revised: 06/27/2013 [facility] Patient Information 2015 [place], [place]. This information is not intended to replace advice given to you by your health care provider. Make sure you discuss any questions you have with your health care provider.

## 2014-07-25 NOTE — ED Provider Notes (Signed)
CSN: [id number]     Arrival date & time 07/25/14  1454 History   First MD Initiated Contact with Patient 07/25/14 1755     Chief Complaint  Patient presents with  . Chest Pain  . Shortness of Breath     (Consider location/radiation/quality/duration/timing/severity/associated sxs/prior Treatment) HPI Comments: Patient presents to the emergency department for evaluation of generalized weakness, near syncope. Patient has been sick for more than a week. She was diagnosed with bronchitis recently and placed on an antibiotic. Since started taking the pill, she has not been feeling like her normal self. She has been getting weak and dizzy. On her way to the ER today she developed chest pain and shortness of breath. Pain was sharp and in the left upper chest, has resolved prior to my evaluation.  Patient is a 34 y.o. female presenting with chest pain and shortness of breath.  Chest Pain Associated symptoms: shortness of breath   Shortness of Breath Associated symptoms: chest pain     Past Medical History  Diagnosis Date  . Headache(784.0)   . Heart palpitations     cardiac workup ok  . Asthma   . Hypoglycemia   . Tailbone injury     fracture  . Ankle fracture, right   . Depression   . Anxiety   . Ovarian cyst   . Abnormal Pap smear    Past Surgical History  Procedure Laterality Date  . Cesarean section    . Tubal ligation    . Laparoscopic ovarian cystectomy    . Breast lumpectomy      left   No family history on file. History  Substance Use Topics  . Smoking status: Never Smoker   . Smokeless tobacco: Never Used  . Alcohol Use: No   OB History    Gravida Para Term Preterm AB TAB SAB Ectopic Multiple Living   2 2 2  0 0 0 0 0 0 2     Review of Systems  Respiratory: Positive for shortness of breath.   Cardiovascular: Positive for chest pain.  All other systems reviewed and are negative.     Allergies  Aspirin  Home Medications   Prior to Admission medications    Medication Sig Start Date End Date Taking? Authorizing Provider  acetaminophen (TYLENOL) 500 MG tablet Take 500 mg by mouth every 6 (six) hours as needed. For headache.    Yes Historical Provider, MD  medroxyPROGESTERone (PROVERA) 5 MG tablet Take 1 tablet (5 mg total) by mouth daily. 09/15/13  Yes Woodroe Mode, MD  predniSONE (DELTASONE) 20 MG tablet Take 40 mg by mouth daily with breakfast.   Yes Historical Provider, MD  naproxen (NAPROSYN) 500 MG tablet Take 1 tablet (500 mg total) by mouth 2 (two) times daily with a meal. Patient not taking: Reported on 07/25/2014 08/04/13   Laray Anger A Anyanwu, MD   BP 131/71 mmHg  Pulse 82  Temp(Src) 98.8 F (37.1 C) (Oral)  Resp 20  SpO2 99% Physical Exam  Constitutional: She is oriented to person, place, and time. She appears well-developed and well-nourished. No distress.  HENT:  Head: Normocephalic and atraumatic.  Right Ear: Hearing normal.  Left Ear: Hearing normal.  Nose: Nose normal.  Mouth/Throat: Oropharynx is clear and moist and mucous membranes are normal.  Eyes: Conjunctivae and EOM are normal. Pupils are equal, round, and reactive to light.  Neck: Normal range of motion. Neck supple.  Cardiovascular: Regular rhythm, S1 normal and S2 normal.  Exam reveals  no gallop and no friction rub.   No murmur heard. Pulmonary/Chest: Effort normal and breath sounds normal. No respiratory distress. She exhibits no tenderness.  Abdominal: Soft. Normal appearance and bowel sounds are normal. There is no hepatosplenomegaly. There is no tenderness. There is no rebound, no guarding, no tenderness at McBurney's point and negative Murphy's sign. No hernia.  Musculoskeletal: Normal range of motion.  Neurological: She is alert and oriented to person, place, and time. She has normal strength. No cranial nerve deficit or sensory deficit. Coordination normal. GCS eye subscore is 4. GCS verbal subscore is 5. GCS motor subscore is 6.  Skin: Skin is warm, dry and  intact. No rash noted. No cyanosis.  Psychiatric: She has a normal mood and affect. Her speech is normal and behavior is normal. Thought content normal.  Nursing note and vitals reviewed.   ED Course  Procedures (including critical care time) Labs Review Labs Reviewed  CBC WITH DIFFERENTIAL/PLATELET - Abnormal; Notable for the following:    WBC 12.5 (*)    Neutro Abs 8.2 (*)    All other components within normal limits  COMPREHENSIVE METABOLIC PANEL - Abnormal; Notable for the following:    Glucose, Bld 105 (*)    All other components within normal limits  D-DIMER, QUANTITATIVE (NOT AT [facility])  Randolm Idol, ED    Imaging Review Dg Chest 2 View  07/25/2014   CLINICAL DATA:  Chest pain, shortness of breath.  EXAM: CHEST  2 VIEW  COMPARISON:  None.  FINDINGS: The heart size and mediastinal contours are within normal limits. Both lungs are clear. No pneumothorax or pleural effusion is noted. The visualized skeletal structures are unremarkable.  IMPRESSION: No active cardiopulmonary disease.   Electronically Signed   By: Marijo Conception, M.D.   On: 07/25/2014 15:59     EKG Interpretation   Date/Time:  Tuesday Jul 25 2014 15:04:39 EDT Ventricular Rate:  64 PR Interval:  146 QRS Duration: 74 QT Interval:  416 QTC Calculation: 429 R Axis:   89 Text Interpretation:  Normal sinus rhythm with sinus arrhythmia  Nonspecific T wave abnormality Abnormal ECG No previous tracing Confirmed  by POLLINA  MD, [place] [phone]) on 07/25/2014 5:58:23 PM      MDM   Final diagnoses:  Generalized weakness  Bronchitis  Asthma, mild intermittent, uncomplicated    Patient presents to the ER for evaluation of multiple problems. Patient has been sick with asthma and bronchitis symptoms for some time. She just finished the Z-Pak today. She developed headache, weakness, chest pain and shortness of breath today. She does not have any abnormal neurologic findings on examination. She does not have  fever and there is no meningismus. Patient's workup has been unremarkable. Chest x-ray does not show pneumonia. D-dimer negative. Cardiac evaluation unremarkable. Patient reassured, continue treatment for her asthma with albuterol. She is just finishing prednisone, is not currently experiencing bronchospasm. I do not feel that she requires any extended course. She was given Solu-Medrol here in the ER.    Orpah Greek, MD 07/25/14 [phone]

## 2014-07-25 NOTE — ED Notes (Signed)
MD at bedside. 

## 2015-04-20 ENCOUNTER — Ambulatory Visit (INDEPENDENT_AMBULATORY_CARE_PROVIDER_SITE_OTHER): Payer: Managed Care, Other (non HMO)

## 2015-04-20 ENCOUNTER — Ambulatory Visit (INDEPENDENT_AMBULATORY_CARE_PROVIDER_SITE_OTHER): Payer: Managed Care, Other (non HMO) | Admitting: Emergency Medicine

## 2015-04-20 VITALS — BP 116/72 | HR 71 | Temp 98.7°F | Resp 18 | Ht <= 58 in | Wt 136.4 lb

## 2015-04-20 DIAGNOSIS — R509 Fever, unspecified: Secondary | ICD-10-CM | POA: Diagnosis not present

## 2015-04-20 DIAGNOSIS — R1011 Right upper quadrant pain: Secondary | ICD-10-CM

## 2015-04-20 DIAGNOSIS — R059 Cough, unspecified: Secondary | ICD-10-CM

## 2015-04-20 DIAGNOSIS — R05 Cough: Secondary | ICD-10-CM

## 2015-04-20 LAB — POCT CBC
Granulocyte percent: 72.8 %G (ref 37–80)
HEMATOCRIT: 38.6 % (ref 37.7–47.9)
HEMOGLOBIN: 13.2 g/dL (ref 12.2–16.2)
Lymph, poc: 0.9 (ref 0.6–3.4)
MCH: 27.9 pg (ref 27–31.2)
MCHC: 34.4 g/dL (ref 31.8–35.4)
MCV: 81.2 fL (ref 80–97)
MID (cbc): 0.3 (ref 0–0.9)
MPV: 8.7 fL (ref 0–99.8)
POC GRANULOCYTE: 3.3 (ref 2–6.9)
POC LYMPH PERCENT: 20 %L (ref 10–50)
POC MID %: 7.2 % (ref 0–12)
Platelet Count, POC: 201 10*3/uL (ref 142–424)
RBC: 4.75 M/uL (ref 4.04–5.48)
RDW, POC: 14.4 %
WBC: 4.6 10*3/uL (ref 4.6–10.2)

## 2015-04-20 LAB — POCT INFLUENZA A/B
Influenza A, POC: NEGATIVE
Influenza B, POC: NEGATIVE

## 2015-04-20 MED ORDER — BENZONATATE 100 MG PO CAPS: 100.0000 mg | ORAL_CAPSULE | Freq: Three times a day (TID) | ORAL | Status: DC | PRN

## 2015-04-20 MED ORDER — AZITHROMYCIN 250 MG PO TABS: ORAL_TABLET | ORAL | Status: DC

## 2015-04-20 NOTE — Progress Notes (Signed)
By signing my name below, I, Sydney Sullivan, attest that this documentation has been prepared under the direction and in the presence of Sydney Queen, MD. Electronically Signed: Moises Sullivan, [place]. 04/20/2015 , 5:48 PM .  Patient was seen in room 9 .  Chief Complaint:  Chief Complaint  Patient presents with  . Flu Like Symptoms    x2 days    HPI: Sydney Sullivan is a 35 y.o. female who reports to [facility] today complaining of flu-like symptoms that started 3 days ago. She states that her symptoms started with some rhinorrhea noticed 3 days ago. She's been having some myalgia, and productive coughs (green phlegm) the day after. She was also running a fever and feeling chills last night. She denies smoking. She denies chance of pregnancy.   She also complains of having intermittent RUQ abdominal pain that's been ongoing for almost a month. It would last about 2-3 days and then it would resolve itself. She mentions having some sensitivity to milk. She also has some sensitivity after eating fried foods.   She's originally from [place].   Past Medical History  Diagnosis Date  . Headache(784.0)   . Heart palpitations     cardiac workup ok  . Asthma   . Hypoglycemia   . Tailbone injury     fracture  . Ankle fracture, right   . Depression   . Anxiety   . Ovarian cyst   . Abnormal Pap smear   . Allergy    Past Surgical History  Procedure Laterality Date  . Cesarean section    . Tubal ligation    . Laparoscopic ovarian cystectomy    . Breast lumpectomy      left   Social History   Social History  . Marital Status: Married    Spouse Name: N/A  . Number of Children: N/A  . Years of Education: N/A   Social History Main Topics  . Smoking status: Never Smoker   . Smokeless tobacco: Never Used  . Alcohol Use: No  . Drug Use: No  . Sexual Activity: Yes    Birth Control/ Protection: Surgical   Other Topics Concern  . None   Social History Narrative    History reviewed. No pertinent family history. Allergies  Allergen Reactions  . Aspirin Anaphylaxis   Prior to Admission medications   Medication Sig Start Date End Date Taking? Authorizing Provider  acetaminophen (TYLENOL) 500 MG tablet Take 500 mg by mouth every 6 (six) hours as needed. Reported on 04/20/2015   Yes Historical Provider, MD  albuterol (PROVENTIL HFA;VENTOLIN HFA) 108 (90 Base) MCG/ACT inhaler Inhale 2 puffs into the lungs every 6 (six) hours as needed for wheezing or shortness of breath.   Yes Historical Provider, MD  medroxyPROGESTERone (PROVERA) 5 MG tablet Take 1 tablet (5 mg total) by mouth daily. Patient not taking: Reported on 04/20/2015 09/15/13   Woodroe Mode, MD  naproxen (NAPROSYN) 500 MG tablet Take 1 tablet (500 mg total) by mouth 2 (two) times daily with a meal. Patient not taking: Reported on 07/25/2014 08/04/13   Osborne Oman, MD  predniSONE (DELTASONE) 20 MG tablet Take 40 mg by mouth daily with breakfast. Reported on 04/20/2015    Historical Provider, MD     ROS:  Constitutional: negative for night sweats, weight changes, or fatigue; positive for fever, chills HEENT: negative for vision changes, hearing loss, ST, epistaxis, or sinus pressure; positive for congestion, rhinorrhea Cardiovascular: negative for chest pain  or palpitations Respiratory: negative for hemoptysis, wheezing, shortness of breath; positive for cough Abdominal: negative for abdominal pain, nausea, vomiting, diarrhea, or constipation Dermatological: negative for rash Musc: positive for myalgia (general) Neurologic: negative for headache, dizziness, or syncope All other systems reviewed and are otherwise negative with the exception to those above and in the HPI.  PHYSICAL EXAM: Filed Vitals:   04/20/15 1709  BP: 116/72  Pulse: 71  Temp: 98.7 F (37.1 C)  Resp: 18   Body mass index is 28.52 kg/(m^2).   General: Alert, no acute distress HEENT:  Normocephalic, atraumatic,  oropharynx patent; Significant nasal congestion Eye: EOMI, PEERLDC Cardiovascular:  Regular rate and rhythm, no rubs murmurs or gallops.  No Carotid bruits, radial pulse intact. No pedal edema.  Respiratory: rhonchi no rales; No cyanosis, no use of accessory musculature Abdominal: No organomegaly, positive bowel sounds. No masses; abdomen has tenderness in RUQ, no rebound Musculoskeletal: Gait intact. No edema, tenderness Skin: No rashes. Neurologic: Facial musculature symmetric. Psychiatric: Patient acts appropriately throughout our interaction.  Lymphatic: No cervical or submandibular lymphadenopathy Genitourinary/Anorectal: No acute findings  LABS: Results for orders placed or performed in visit on 04/20/15  POCT Influenza A/B  Result Value Ref Range   Influenza A, POC Negative Negative   Influenza B, POC Negative Negative  POCT CBC  Result Value Ref Range   WBC 4.6 4.6 - 10.2 K/uL   Lymph, poc 0.9 0.6 - 3.4   POC LYMPH PERCENT 20.0 10 - 50 %L   MID (cbc) 0.3 0 - 0.9   POC MID % 7.2 0 - 12 %M   POC Granulocyte 3.3 2 - 6.9   Granulocyte percent 72.8 37 - 80 %G   RBC 4.75 4.04 - 5.48 M/uL   Hemoglobin 13.2 12.2 - 16.2 g/dL   HCT, POC 38.6 37.7 - 47.9 %   MCV 81.2 80 - 97 fL   MCH, POC 27.9 27 - 31.2 pg   MCHC 34.4 31.8 - 35.4 g/dL   RDW, POC 14.4 %   Platelet Count, POC 201 142 - 424 K/uL   MPV 8.7 0 - 99.8 fL    EKG/XRAY:   Dg Chest 2 View  04/20/2015  CLINICAL DATA:  35 year old presenting with acute onset of shortness of breath, productive cough, rhinorrhea and myalgias which began 3 days ago. EXAM: CHEST  2 VIEW COMPARISON:  None. FINDINGS: Cardiomediastinal silhouette unremarkable. Subcentimeter dense nodule in the superior segment left lower lobe. Lungs otherwise clear. Bronchovascular markings normal. No localized airspace consolidation. No pleural effusions. No pneumothorax. Normal pulmonary vascularity. Visualized bony thorax intact. IMPRESSION: 1.  No acute  cardiopulmonary disease. 2. Likely benign subcentimeter calcified granuloma in the superior segment left lower lobe. In the absence of prior chest x-rays for comparison, a follow-up chest x-ray in 3 months may be helpful to confirm stability. Electronically Signed   By: Evangeline Dakin M.D.   On: 04/20/2015 18:08    ASSESSMENT/PLAN: patient presents with flu like symptoms. White count appears viral. Chest x-ray did not show any definite pneumonia. She is coughing up green phlegm.  There is an abnormal calcified area on chest x-ray. Will advise to repeat this in 3 months as recommended by radiology.I personally performed the services described in this documentation, which was scribed in my presence. The recorded information has been reviewed and is accurate. She also has symptoms that are worrisome for gallbladder disease.  Labs done and will schedule an ultrasound of the right upper abdomen.I personally performed  the services described in this documentation, which was scribed in my presence. The recorded information has been reviewed and is accurate.   Gross sideeffects, risk and benefits, and alternatives of medications d/w patient. Patient is aware that all medications have potential sideeffects and we are unable to predict every sideeffect or drug-drug interaction that may occur.  Sydney Queen MD 04/20/2015 5:48 PM

## 2015-04-20 NOTE — Patient Instructions (Addendum)
Because you received an x-ray today, you will receive an invoice from [facility]. Please contact [facility] at [phone] with questions or concerns regarding your invoice. Our billing staff will not be able to assist you with those questions.  I have scheduled you for an ultrasound of your abdomen.  You will need a repeat chest x-ray in 3 months to follow-up on a calcified area left midlung.

## 2015-04-21 LAB — COMPLETE METABOLIC PANEL WITH GFR
ALBUMIN: 4.3 g/dL (ref 3.6–5.1)
ALK PHOS: 59 U/L (ref 33–115)
ALT: 14 U/L (ref 6–29)
AST: 16 U/L (ref 10–30)
BUN: 10 mg/dL (ref 7–25)
CO2: 23 mmol/L (ref 20–31)
Calcium: 9.4 mg/dL (ref 8.6–10.2)
Chloride: 103 mmol/L (ref 98–110)
Creat: 0.73 mg/dL (ref 0.50–1.10)
Glucose, Bld: 89 mg/dL (ref 65–99)
Potassium: 4.5 mmol/L (ref 3.5–5.3)
Sodium: 137 mmol/L (ref 135–146)
TOTAL PROTEIN: 7.3 g/dL (ref 6.1–8.1)
Total Bilirubin: 0.3 mg/dL (ref 0.2–1.2)

## 2015-04-21 LAB — AMYLASE: Amylase: 61 U/L (ref 0–105)

## 2015-04-21 LAB — LIPASE: Lipase: 16 U/L (ref 7–60)

## 2015-04-22 ENCOUNTER — Encounter: Payer: Self-pay | Admitting: *Deleted

## 2015-05-04 ENCOUNTER — Ambulatory Visit
Admission: RE | Admit: 2015-05-04 | Discharge: 2015-05-04 | Disposition: A | Discharge status: Home / Self Care | Payer: Commercial Indemnity | Source: Ambulatory Visit | Attending: Emergency Medicine | Admitting: Emergency Medicine

## 2015-05-04 DIAGNOSIS — R1011 Right upper quadrant pain: Secondary | ICD-10-CM

## 2015-10-29 ENCOUNTER — Encounter (HOSPITAL_COMMUNITY): Payer: Self-pay | Admitting: *Deleted

## 2015-10-29 ENCOUNTER — Emergency Department (HOSPITAL_COMMUNITY)
Admission: EM | Admit: 2015-10-29 | Discharge: 2015-10-29 | Disposition: A | Discharge status: Home / Self Care | Payer: Commercial Indemnity | Attending: Emergency Medicine | Admitting: Emergency Medicine

## 2015-10-29 DIAGNOSIS — H9202 Otalgia, left ear: Secondary | ICD-10-CM | POA: Diagnosis not present

## 2015-10-29 DIAGNOSIS — H61892 Other specified disorders of left external ear: Secondary | ICD-10-CM

## 2015-10-29 DIAGNOSIS — J45909 Unspecified asthma, uncomplicated: Secondary | ICD-10-CM | POA: Diagnosis not present

## 2015-10-29 NOTE — ED Provider Notes (Signed)
[facility] DEPT Provider Note   CSN: [id number] Arrival date & time: 10/29/15  0227     History   Chief Complaint Chief Complaint  Patient presents with  . Foreign Body in [place] is a 35 y.o. female.  Patient is a 35 y/o female presenting for sensation of FB in her L ear, onset tonight. Patient was talking on the phone when she felt that a bug may have flown in her ear. She flushed her ear with peroxide, but still feels the sensation of a foreign body. No pain. No fevers. No drainage from the ear. No bleeding. No tinnitus or hearing loss. Patient denies nasal congestion and rhinorrhea.     Past Medical History:  Diagnosis Date  . Abnormal Pap smear   . Allergy   . Ankle fracture, right   . Anxiety   . Asthma   . Depression   . Headache(784.0)   . Heart palpitations    cardiac workup ok  . Hypoglycemia   . Ovarian cyst   . Tailbone injury    fracture    Patient Active Problem List   Diagnosis Date Noted  . Pelvic pain in female 09/15/2013  . Dysmenorrhea 09/15/2013    Past Surgical History:  Procedure Laterality Date  . BREAST LUMPECTOMY     left  . CESAREAN SECTION    . LAPAROSCOPIC OVARIAN CYSTECTOMY    . TUBAL LIGATION      OB History    Gravida Para Term Preterm AB Living   2 2 2  0 0 2   SAB TAB Ectopic Multiple Live Births   0 0 0 0 2       Home Medications    Prior to Admission medications   Medication Sig Start Date End Date Taking? Authorizing Provider  acetaminophen (TYLENOL) 500 MG tablet Take 500 mg by mouth every 6 (six) hours as needed. Reported on 04/20/2015    Historical Provider, MD  albuterol (PROVENTIL HFA;VENTOLIN HFA) 108 (90 Base) MCG/ACT inhaler Inhale 2 puffs into the lungs every 6 (six) hours as needed for wheezing or shortness of breath.    Historical Provider, MD  azithromycin (ZITHROMAX) 250 MG tablet Take 2 tabs PO x 1 dose, then 1 tab PO QD x 4 days 04/20/15   Darlyne Russian, MD  benzonatate  (TESSALON) 100 MG capsule Take 1-2 capsules (100-200 mg total) by mouth 3 (three) times daily as needed for cough. 04/20/15   Darlyne Russian, MD  medroxyPROGESTERone (PROVERA) 5 MG tablet Take 1 tablet (5 mg total) by mouth daily. Patient not taking: Reported on 04/20/2015 09/15/13   Woodroe Mode, MD  naproxen (NAPROSYN) 500 MG tablet Take 1 tablet (500 mg total) by mouth 2 (two) times daily with a meal. Patient not taking: Reported on 07/25/2014 08/04/13   Osborne Oman, MD  predniSONE (DELTASONE) 20 MG tablet Take 40 mg by mouth daily with breakfast. Reported on 04/20/2015    Historical Provider, MD    Family History No family history on file.  Social History Social History  Substance Use Topics  . Smoking status: Never Smoker  . Smokeless tobacco: Never Used  . Alcohol use No     Allergies   Aspirin   Review of Systems Review of Systems  Eyes: Positive for pain (FB sensation).  Ten systems reviewed and are negative for acute change, except as noted in the HPI.     Physical Exam Updated Vital Signs  BP 118/74   Pulse 72   Temp 98.4 F (36.9 C)   Resp 16   Ht 4\' 10"  (1.473 m)   Wt 65.8 kg   LMP 10/08/2015   SpO2 100%   BMI 30.33 kg/m   Physical Exam  Constitutional: She is oriented to person, place, and time. She appears well-developed and well-nourished. No distress.  Alert and appropriate for age. Nontoxic.  HENT:  Head: Normocephalic and atraumatic.  Right Ear: Hearing, external ear and ear canal normal. No foreign bodies.  Left Ear: Hearing, tympanic membrane and external ear normal. No foreign bodies.  No foreign body visualized in ear canal bilaterally. No tenderness when palpating the left tragus or when pulling on the left auricle. No tympanic membrane changes; no bulging, retraction, or perforation bilaterally. No erythema to TM or ear canal. No canal edema.  Eyes: Conjunctivae and EOM are normal. No scleral icterus.  Neck: Normal range of motion.  No  nuchal rigidity or meningismus  Pulmonary/Chest: Effort normal. No respiratory distress.  Respirations even and unlabored  Musculoskeletal: Normal range of motion.  Neurological: She is alert and oriented to person, place, and time.  GCS 15. Patient ambulatory with steady gait.  Skin: Skin is warm and dry. No rash noted. She is not diaphoretic. No erythema. No pallor.  Psychiatric: She has a normal mood and affect. Her behavior is normal.  Nursing note and vitals reviewed.    ED Treatments / Results  Labs (all labs ordered are listed, but only abnormal results are displayed) Labs Reviewed - No data to display  EKG  EKG Interpretation None       Radiology No results found.  Procedures Procedures (including critical care time)  Medications Ordered in ED Medications - No data to display   Initial Impression / Assessment and Plan / ED Course  I have reviewed the triage vital signs and the nursing notes.  Pertinent labs & imaging results that were available during my care of the patient were reviewed by me and considered in my medical decision making (see chart for details).  Clinical Course    35 year old female presents to the emergency department for evaluation of foreign body sensation in her left ear canal. Exam is reassuring and without evidence of foreign body in the left ear. No evidence to suggest infection. Patient pleasant and afebrile. No indication for further emergent workup. Will refer to ENT. Return precautions given. Patient discharged in satisfactory condition.   Final Clinical Impressions(s) / ED Diagnoses   Final diagnoses:  Foreign body sensation in ear canal, left    New Prescriptions New Prescriptions   No medications on file     Antonietta Breach, PA-C 10/29/15 [place], DO 10/29/15 [phone]

## 2015-10-29 NOTE — Discharge Instructions (Signed)
Consulte a un mdico otorrinolaringlogo si los sntomas persisten. No tiene evidencia de infeccin en su odo. No hay bichos o insectos en el canal auditivo. Usted puede regresar por cualquier sntoma nuevo o relacionado.  See an ENT doctor if symptoms persist. You have no evidence of infection in your ear. There are no bugs or insects in your ear canal. You may return for any new or concerning symptoms.

## 2015-10-29 NOTE — ED Triage Notes (Signed)
The pt is c/o lt ear pain  She thinks she may have a bug inside.  No pain.  lmp aug 13th

## 2015-10-30 ENCOUNTER — Ambulatory Visit (INDEPENDENT_AMBULATORY_CARE_PROVIDER_SITE_OTHER): Payer: Commercial Indemnity

## 2015-10-30 DIAGNOSIS — R399 Unspecified symptoms and signs involving the genitourinary system: Secondary | ICD-10-CM

## 2015-10-30 LAB — POCT URINALYSIS DIP (DEVICE)
BILIRUBIN URINE: NEGATIVE
Glucose, UA: NEGATIVE mg/dL
Hgb urine dipstick: NEGATIVE
Ketones, ur: NEGATIVE mg/dL
Leukocytes, UA: NEGATIVE
NITRITE: NEGATIVE
PH: 6 (ref 5.0–8.0)
Protein, ur: NEGATIVE mg/dL
Specific Gravity, Urine: 1.02 (ref 1.005–1.030)
Urobilinogen, UA: 0.2 mg/dL (ref 0.0–1.0)

## 2015-10-30 NOTE — Progress Notes (Signed)
Patient presented to office clinic today to have her urine check. Patient denies any dysuria at this and her urine is normal.

## 2015-11-13 ENCOUNTER — Ambulatory Visit (INDEPENDENT_AMBULATORY_CARE_PROVIDER_SITE_OTHER): Payer: Managed Care, Other (non HMO) | Admitting: Urgent Care

## 2015-11-13 ENCOUNTER — Emergency Department (HOSPITAL_COMMUNITY)
Admission: EM | Admit: 2015-11-13 | Discharge: 2015-11-14 | Disposition: A | Discharge status: Home / Self Care | Payer: Commercial Indemnity | Attending: Emergency Medicine | Admitting: Emergency Medicine

## 2015-11-13 ENCOUNTER — Ambulatory Visit (INDEPENDENT_AMBULATORY_CARE_PROVIDER_SITE_OTHER): Payer: Managed Care, Other (non HMO)

## 2015-11-13 ENCOUNTER — Encounter (HOSPITAL_COMMUNITY): Payer: Self-pay

## 2015-11-13 VITALS — BP 124/80 | HR 82 | Temp 98.3°F | Resp 17 | Ht 58.5 in | Wt 144.0 lb

## 2015-11-13 DIAGNOSIS — J309 Allergic rhinitis, unspecified: Secondary | ICD-10-CM

## 2015-11-13 DIAGNOSIS — J209 Acute bronchitis, unspecified: Secondary | ICD-10-CM

## 2015-11-13 DIAGNOSIS — R079 Chest pain, unspecified: Secondary | ICD-10-CM

## 2015-11-13 DIAGNOSIS — R0602 Shortness of breath: Secondary | ICD-10-CM | POA: Diagnosis not present

## 2015-11-13 DIAGNOSIS — Z79899 Other long term (current) drug therapy: Secondary | ICD-10-CM | POA: Diagnosis not present

## 2015-11-13 DIAGNOSIS — R0789 Other chest pain: Secondary | ICD-10-CM

## 2015-11-13 DIAGNOSIS — J45909 Unspecified asthma, uncomplicated: Secondary | ICD-10-CM

## 2015-11-13 DIAGNOSIS — R42 Dizziness and giddiness: Secondary | ICD-10-CM

## 2015-11-13 LAB — BASIC METABOLIC PANEL
ANION GAP: 12 (ref 5–15)
BUN: 11 mg/dL (ref 6–20)
CHLORIDE: 102 mmol/L (ref 101–111)
CO2: 26 mmol/L (ref 22–32)
Calcium: 9.9 mg/dL (ref 8.9–10.3)
Creatinine, Ser: 0.6 mg/dL (ref 0.44–1.00)
GFR calc non Af Amer: >60 mL/min (ref >60)
Glucose, Bld: 119 mg/dL — ABNORMAL HIGH (ref 65–99)
POTASSIUM: 3.4 mmol/L — AB (ref 3.5–5.1)
Sodium: 140 mmol/L (ref 135–145)

## 2015-11-13 LAB — CBC
HCT: 39.9 % (ref 36.0–46.0)
HEMOGLOBIN: 12.1 g/dL (ref 12.0–15.0)
MCH: 26 pg (ref 26.0–34.0)
MCHC: 30.3 g/dL (ref 30.0–36.0)
MCV: 85.6 fL (ref 78.0–100.0)
Platelets: 263 10*3/uL (ref 150–400)
RBC: 4.66 MIL/uL (ref 3.87–5.11)
RDW: 14.7 % (ref 11.5–15.5)
WBC: 7 10*3/uL (ref 4.0–10.5)

## 2015-11-13 LAB — POCT CBC
GRANULOCYTE PERCENT: 57.2 % (ref 37–80)
HEMATOCRIT: 37.4 % — AB (ref 37.7–47.9)
Hemoglobin: 12.7 g/dL (ref 12.2–16.2)
Lymph, poc: 2.4 (ref 0.6–3.4)
MCH, POC: 27.1 pg (ref 27–31.2)
MCHC: 33.9 g/dL (ref 31.8–35.4)
MCV: 79.9 fL — AB (ref 80–97)
MID (cbc): 0.7 (ref 0–0.9)
MPV: 8.8 fL (ref 0–99.8)
POC GRANULOCYTE: 4.1 (ref 2–6.9)
POC LYMPH %: 33.5 % (ref 10–50)
POC MID %: 9.3 % (ref 0–12)
Platelet Count, POC: 245 10*3/uL (ref 142–424)
RBC: 4.68 M/uL (ref 4.04–5.48)
RDW, POC: 15.7 %
WBC: 7.2 10*3/uL (ref 4.6–10.2)

## 2015-11-13 LAB — I-STAT TROPONIN, ED: TROPONIN I, POC: 0 ng/mL (ref 0.00–0.08)

## 2015-11-13 MED ORDER — ALBUTEROL SULFATE (2.5 MG/3ML) 0.083% IN NEBU
2.5000 mg | INHALATION_SOLUTION | Freq: Once | RESPIRATORY_TRACT | Status: AC
  Administered 2015-11-13: 2.5 mg via RESPIRATORY_TRACT

## 2015-11-13 MED ORDER — ALBUTEROL SULFATE (2.5 MG/3ML) 0.083% IN NEBU: 2.5000 mg | INHALATION_SOLUTION | Freq: Four times a day (QID) | RESPIRATORY_TRACT | 1 refills | Status: DC | PRN

## 2015-11-13 MED ORDER — FLUTICASONE PROPIONATE 50 MCG/ACT NA SUSP: 2.0000 | Freq: Every day | NASAL | 11 refills | Status: DC

## 2015-11-13 MED ORDER — LEVALBUTEROL HCL 0.63 MG/3ML IN NEBU
0.6300 mg | INHALATION_SOLUTION | Freq: Once | RESPIRATORY_TRACT | Status: AC
  Administered 2015-11-13: 0.63 mg via RESPIRATORY_TRACT

## 2015-11-13 MED ORDER — MONTELUKAST SODIUM 10 MG PO TABS: 10.0000 mg | ORAL_TABLET | Freq: Every day | ORAL | 3 refills | Status: DC

## 2015-11-13 MED ORDER — CETIRIZINE HCL 10 MG PO TABS: 10.0000 mg | ORAL_TABLET | Freq: Every day | ORAL | 3 refills | Status: DC

## 2015-11-13 MED ORDER — AZITHROMYCIN 250 MG PO TABS: ORAL_TABLET | ORAL | 0 refills | Status: DC

## 2015-11-13 MED ORDER — IPRATROPIUM BROMIDE 0.02 % IN SOLN
0.5000 mg | Freq: Once | RESPIRATORY_TRACT | Status: AC
  Administered 2015-11-13: 0.5 mg via RESPIRATORY_TRACT

## 2015-11-13 MED ORDER — ALBUTEROL SULFATE HFA 108 (90 BASE) MCG/ACT IN AERS: 2.0000 | INHALATION_SPRAY | Freq: Four times a day (QID) | RESPIRATORY_TRACT | 1 refills | Status: DC | PRN

## 2015-11-13 MED ORDER — PREDNISONE 20 MG PO TABS: 40.0000 mg | ORAL_TABLET | Freq: Every day | ORAL | 0 refills | Status: DC

## 2015-11-13 NOTE — Discharge Instructions (Signed)
Fill your prescriptions as provided by urgent care earlier today  Take ibuprofen 600 mg every 6 hours as needed for pain.  Return to the emergency department if your symptoms significantly worsen or change.

## 2015-11-13 NOTE — ED Provider Notes (Signed)
[facility] DEPT Provider Note   CSN: [id number] Arrival date & time: 11/13/15  1704  By signing my name below, I, Delton Prairie, attest that this documentation has been prepared under the direction and in the presence of Veryl Speak, MD  Electronically Signed: Delton Prairie, ED Scribe. 11/13/15. 11:39 PM.   History   Chief Complaint Chief Complaint  Patient presents with  . Chest Pain     The history is provided by the patient and the spouse. No language interpreter was used.  Chest Pain   This is a new problem. The current episode started 12 to 24 hours ago. The problem occurs hourly. The problem has not changed since onset.The pain is moderate. The pain does not radiate. Associated symptoms include dizziness and shortness of breath. Treatments tried: aluterol  The treatment provided moderate relief.   HPI Comments:  Sydney Sullivan is a 35 y.o. female who presents to the Emergency Department complaining of intermittent, moderate left sided chest pain onset 10AM today. Pt notes theses episodes last for about 2 minutes and reoccur every 30 minutes to an hour. Pt's husband notes pt has associated dizziness and SOB. Husband states the pain begins when she lifts her arms. Pt was taken to Urgent Care and they were told pt has bronchitis. She was given albuterol twice with relief. He notes pt's pain has not subsided. Pt notes associated pressurized left sided head pain onset 1 day ago. She denies heavy lifting, fevers, or an associated cough. Pt was prescribed an antibiotic but has not been able to take the medication. Pt is otherwise healthy.   Past Medical History:  Diagnosis Date  . Abnormal Pap smear   . Allergy   . Ankle fracture, right   . Anxiety   . Asthma   . Depression   . Headache(784.0)   . Heart palpitations    cardiac workup ok  . Hypoglycemia   . Ovarian cyst   . Tailbone injury    fracture    Patient Active Problem List   Diagnosis Date Noted  .  Pelvic pain in female 09/15/2013  . Dysmenorrhea 09/15/2013    Past Surgical History:  Procedure Laterality Date  . BREAST LUMPECTOMY     left  . CESAREAN SECTION    . LAPAROSCOPIC OVARIAN CYSTECTOMY    . TUBAL LIGATION      OB History    Gravida Para Term Preterm AB Living   2 2 2  0 0 2   SAB TAB Ectopic Multiple Live Births   0 0 0 0 2       Home Medications    Prior to Admission medications   Medication Sig Start Date End Date Taking? Authorizing Provider  albuterol (PROVENTIL HFA;VENTOLIN HFA) 108 (90 Base) MCG/ACT inhaler Inhale 2 puffs into the lungs every 6 (six) hours as needed for wheezing or shortness of breath.    Historical Provider, MD  albuterol (PROVENTIL HFA;VENTOLIN HFA) 108 (90 Base) MCG/ACT inhaler Inhale 2 puffs into the lungs every 6 (six) hours as needed for wheezing or shortness of breath (cough, shortness of breath or wheezing.). 11/13/15   Jaynee Eagles, PA-C  albuterol (PROVENTIL) (2.5 MG/3ML) 0.083% nebulizer solution Take 3 mLs (2.5 mg total) by nebulization every 6 (six) hours as needed for wheezing or shortness of breath. 11/13/15   Jaynee Eagles, PA-C  azithromycin (ZITHROMAX) 250 MG tablet Start with 2 tablets today, then 1 daily thereafter. 11/13/15   Jaynee Eagles, PA-C  cetirizine (ZYRTEC) 10 MG  tablet Take 1 tablet (10 mg total) by mouth daily. 11/13/15   Jaynee Eagles, PA-C  fluticasone [facility]) 50 MCG/ACT nasal spray Place 2 sprays into both nostrils daily. 11/13/15   Jaynee Eagles, PA-C  montelukast (SINGULAIR) 10 MG tablet Take 1 tablet (10 mg total) by mouth at bedtime. 11/13/15   Jaynee Eagles, PA-C  predniSONE (DELTASONE) 20 MG tablet Take 2 tablets (40 mg total) by mouth daily with breakfast. 11/13/15   Jaynee Eagles, PA-C    Family History No family history on file.  Social History Social History  Substance Use Topics  . Smoking status: Never Smoker  . Smokeless tobacco: Never Used  . Alcohol use No     Allergies   Aspirin   Review of  Systems Review of Systems  Respiratory: Positive for shortness of breath.   Cardiovascular: Positive for chest pain.  Neurological: Positive for dizziness.  All other systems reviewed and are negative.    Physical Exam Updated Vital Signs BP 133/77   Pulse 76   Temp 98.3 F (36.8 C)   Resp 18   LMP 11/12/2015 (Approximate)   SpO2 98%   Physical Exam  Constitutional: She is oriented to person, place, and time. She appears well-developed and well-nourished. No distress.  HENT:  Head: Normocephalic and atraumatic.  Right Ear: Hearing normal.  Left Ear: Hearing normal.  Nose: Nose normal.  Mouth/Throat: Oropharynx is clear and moist and mucous membranes are normal.  Eyes: Conjunctivae and EOM are normal. Pupils are equal, round, and reactive to light.  Neck: Normal range of motion. Neck supple.  Cardiovascular: Regular rhythm, S1 normal and S2 normal.  Exam reveals no gallop and no friction rub.   No murmur heard. Pulmonary/Chest: Effort normal and breath sounds normal. No respiratory distress. She exhibits no tenderness.  Abdominal: Soft. Normal appearance and bowel sounds are normal. There is no hepatosplenomegaly. There is no tenderness. There is no rebound, no guarding, no tenderness at McBurney's point and negative Murphy's sign. No hernia.  Musculoskeletal: Normal range of motion.  Neurological: She is alert and oriented to person, place, and time. She has normal strength. No cranial nerve deficit or sensory deficit. Coordination normal. GCS eye subscore is 4. GCS verbal subscore is 5. GCS motor subscore is 6.  Skin: Skin is warm, dry and intact. No rash noted. No cyanosis.  Psychiatric: She has a normal mood and affect. Her speech is normal and behavior is normal. Thought content normal.  Nursing note and vitals reviewed.    ED Treatments / Results  DIAGNOSTIC STUDIES:  Oxygen Saturation is 98% on RA, normal by my interpretation.    COORDINATION OF CARE:  11:48 PM  Discussed treatment plan with pt at bedside and pt agreed to plan.  Labs (all labs ordered are listed, but only abnormal results are displayed) Labs Reviewed  BASIC METABOLIC PANEL - Abnormal; Notable for the following:       Result Value   Potassium 3.4 (*)    Glucose, Bld 119 (*)    All other components within normal limits  CBC  I-STAT TROPOININ, ED    EKG  EKG Interpretation None       Radiology Dg Chest 2 View  Result Date: 11/13/2015 CLINICAL DATA:  Chest pain and shortness of breath. EXAM: CHEST  2 VIEW COMPARISON:  04/20/2015 FINDINGS: Dense 4 mm left mid lung nodule, no change from 07/25/2014, most likely calcified granuloma. Airway thickening is present, suggesting bronchitis or reactive airways disease. Cardiac and  mediastinal margins appear normal. No pleural effusion. IMPRESSION: 1. Airway thickening is present, suggesting bronchitis or reactive airways disease. Electronically Signed   By: Van Clines M.D.   On: 11/13/2015 15:06    Procedures Procedures (including critical care time)  Medications Ordered in ED Medications - No data to display   Initial Impression / Assessment and Plan / ED Course  I have reviewed the triage vital signs and the nursing notes.  Pertinent labs & imaging results that were available during my care of the patient were reviewed by me and considered in my medical decision making (see chart for details).  Clinical Course    Patient presents with complaints of chest discomfort. Began nearly 24 hours ago in the absence of any injury or trauma. She has no risk factors and cardiac workup is unremarkable. She will be discharged, to return as needed for any problems.  Final Clinical Impressions(s) / ED Diagnoses   Final diagnoses:  None    New Prescriptions New Prescriptions   No medications on file  I personally performed the services described in this documentation, which was scribed in my presence. The recorded information  has been reviewed and is accurate.        Veryl Speak, MD 11/14/15 0700

## 2015-11-13 NOTE — ED Triage Notes (Signed)
Patient complains of left anterior chest pain since early am. States that the pain makes her feel as if she cannot get a deep breath. Seen at [facility] urgent [facility] earlier today and diagnosed with bronchitis, requesting further evaluation. No cough or cold symptoms

## 2015-11-13 NOTE — Patient Instructions (Addendum)
Acute Bronchitis Bronchitis is inflammation of the airways that extend from the windpipe into the lungs (bronchi). The inflammation often causes mucus to develop. This leads to a cough, which is the most common symptom of bronchitis.  In acute bronchitis, the condition usually develops suddenly and goes away over time, usually in a couple weeks. Smoking, allergies, and asthma can make bronchitis worse. Repeated episodes of bronchitis may cause further lung problems.  CAUSES Acute bronchitis is most often caused by the same virus that causes a cold. The virus can spread from person to person (contagious) through coughing, sneezing, and touching contaminated objects. SIGNS AND SYMPTOMS   Cough.   Fever.   Coughing up mucus.   Body aches.   Chest congestion.   Chills.   Shortness of breath.   Sore throat.  DIAGNOSIS  Acute bronchitis is usually diagnosed through a physical exam. Your health care provider will also ask you questions about your medical history. Tests, such as chest X-rays, are sometimes done to rule out other conditions.  TREATMENT  Acute bronchitis usually goes away in a couple weeks. Oftentimes, no medical treatment is necessary. Medicines are sometimes given for relief of fever or cough. Antibiotic medicines are usually not needed but may be prescribed in certain situations. In some cases, an inhaler may be recommended to help reduce shortness of breath and control the cough. A cool mist vaporizer may also be used to help thin bronchial secretions and make it easier to clear the chest.  HOME CARE INSTRUCTIONS  Get plenty of rest.   Drink enough fluids to keep your urine clear or pale yellow (unless you have a medical condition that requires fluid restriction). Increasing fluids may help thin your respiratory secretions (sputum) and reduce chest congestion, and it will prevent dehydration.   Take medicines only as directed by your health care provider.  If  you were prescribed an antibiotic medicine, finish it all even if you start to feel better.  Avoid smoking and secondhand smoke. Exposure to cigarette smoke or irritating chemicals will make bronchitis worse. If you are a smoker, consider using nicotine gum or skin patches to help control withdrawal symptoms. Quitting smoking will help your lungs heal faster.   Reduce the chances of another bout of acute bronchitis by washing your hands frequently, avoiding people with cold symptoms, and trying not to touch your hands to your mouth, nose, or eyes.   Keep all follow-up visits as directed by your health care provider.  SEEK MEDICAL CARE IF: Your symptoms do not improve after 1 week of treatment.  SEEK IMMEDIATE MEDICAL CARE IF:  You develop an increased fever or chills.   You have chest pain.   You have severe shortness of breath.  You have bloody sputum.   You develop dehydration.  You faint or repeatedly feel like you are going to pass out.  You develop repeated vomiting.  You develop a severe headache. MAKE SURE YOU:   Understand these instructions.  Will watch your condition.  Will get help right away if you are not doing well or get worse.   This information is not intended to replace advice given to you by your health care provider. Make sure you discuss any questions you have with your health care provider.   Document Released: 03/20/2004 Document Revised: 03/03/2014 Document Reviewed: 08/03/2012 Elsevier Interactive Patient Education 2016 [employer]     Asthma, Adult Asthma is a recurring condition in which the airways tighten and narrow. Asthma  can make it difficult to breathe. It can cause coughing, wheezing, and shortness of breath. Asthma episodes, also called asthma attacks, range from minor to life-threatening. Asthma cannot be cured, but medicines and lifestyle changes can help control it. CAUSES Asthma is believed to be caused by inherited  (genetic) and environmental factors, but its exact cause is unknown. Asthma may be triggered by allergens, lung infections, or irritants in the air. Asthma triggers are different for each person. Common triggers include:   Animal dander.  Dust mites.  Cockroaches.  Pollen from trees or grass.  Mold.  Smoke.  Air pollutants such as dust, household cleaners, hair sprays, aerosol sprays, paint fumes, strong chemicals, or strong odors.  Cold air, weather changes, and winds (which increase molds and pollens in the air).  Strong emotional expressions such as crying or laughing hard.  Stress.  Certain medicines (such as aspirin) or types of drugs (such as beta-blockers).  Sulfites in foods and drinks. Foods and drinks that may contain sulfites include dried fruit, potato chips, and sparkling grape juice.  Infections or inflammatory conditions such as the flu, a cold, or an inflammation of the nasal membranes (rhinitis).  Gastroesophageal reflux disease (GERD).  Exercise or strenuous activity. SYMPTOMS Symptoms may occur immediately after asthma is triggered or many hours later. Symptoms include:  Wheezing.  Excessive nighttime or early morning coughing.  Frequent or severe coughing with a common cold.  Chest tightness.  Shortness of breath. DIAGNOSIS  The diagnosis of asthma is made by a review of your medical history and a physical exam. Tests may also be performed. These may include:  Lung function studies. These tests show how much air you breathe in and out.  Allergy tests.  Imaging tests such as X-rays. TREATMENT  Asthma cannot be cured, but it can usually be controlled. Treatment involves identifying and avoiding your asthma triggers. It also involves medicines. There are 2 classes of medicine used for asthma treatment:   Controller medicines. These prevent asthma symptoms from occurring. They are usually taken every day.  Reliever or rescue medicines. These  quickly relieve asthma symptoms. They are used as needed and provide short-term relief. Your health care provider will help you create an asthma action plan. An asthma action plan is a written plan for managing and treating your asthma attacks. It includes a list of your asthma triggers and how they may be avoided. It also includes information on when medicines should be taken and when their dosage should be changed. An action plan may also involve the use of a device called a peak flow meter. A peak flow meter measures how well the lungs are working. It helps you monitor your condition. HOME CARE INSTRUCTIONS   Take medicines only as directed by your health care provider. Speak with your health care provider if you have questions about how or when to take the medicines.  Use a peak flow meter as directed by your health care provider. Record and keep track of readings.  Understand and use the action plan to help minimize or stop an asthma attack without needing to seek medical care.  Control your home environment in the following ways to help prevent asthma attacks:  Do not smoke. Avoid being exposed to secondhand smoke.  Change your heating and air conditioning filter regularly.  Limit your use of fireplaces and wood stoves.  Get rid of pests (such as roaches and mice) and their droppings.  Throw away plants if you see mold on  them.  Clean your floors and dust regularly. Use unscented cleaning products.  Try to have someone else vacuum for you regularly. Stay out of rooms while they are being vacuumed and for a short while afterward. If you vacuum, use a dust mask from a hardware store, a double-layered or microfilter vacuum cleaner bag, or a vacuum cleaner with a HEPA filter.  Replace carpet with wood, tile, or vinyl flooring. Carpet can trap dander and dust.  Use allergy-proof pillows, mattress covers, and box spring covers.  Wash bed sheets and blankets every week in hot water and  dry them in a dryer.  Use blankets that are made of polyester or cotton.  Clean bathrooms and kitchens with bleach. If possible, have someone repaint the walls in these rooms with mold-resistant paint. Keep out of the rooms that are being cleaned and painted.  Wash hands frequently. SEEK MEDICAL CARE IF:   You have wheezing, shortness of breath, or a cough even if taking medicine to prevent attacks.  The colored mucus you cough up (sputum) is thicker than usual.  Your sputum changes from clear or white to yellow, green, gray, or bloody.  You have any problems that may be related to the medicines you are taking (such as a rash, itching, swelling, or trouble breathing).  You are using a reliever medicine more than 2-3 times per week.  Your peak flow is still at 50-79% of your personal best after following your action plan for 1 hour.  You have a fever. SEEK IMMEDIATE MEDICAL CARE IF:   You seem to be getting worse and are unresponsive to treatment during an asthma attack.  You are short of breath even at rest.  You get short of breath when doing very little physical activity.  You have difficulty eating, drinking, or talking due to asthma symptoms.  You develop chest pain.  You develop a fast heartbeat.  You have a bluish color to your lips or fingernails.  You are light-headed, dizzy, or faint.  Your peak flow is less than 50% of your personal best.   This information is not intended to replace advice given to you by your health care provider. Make sure you discuss any questions you have with your health care provider.   Document Released: 02/10/2005 Document Revised: 11/01/2014 Document Reviewed: 09/09/2012 Elsevier Interactive Patient Education 2016 [employer].    Allergic Rhinitis Allergic rhinitis is when the mucous membranes in the nose respond to allergens. Allergens are particles in the air that cause your body to have an allergic reaction. This causes you  to release allergic antibodies. Through a chain of events, these eventually cause you to release histamine into the blood stream. Although meant to protect the body, it is this release of histamine that causes your discomfort, such as frequent sneezing, congestion, and an itchy, runny nose.  CAUSES Seasonal allergic rhinitis (hay fever) is caused by pollen allergens that may come from grasses, trees, and weeds. Year-round allergic rhinitis (perennial allergic rhinitis) is caused by allergens such as house dust mites, pet dander, and mold spores. SYMPTOMS  Nasal stuffiness (congestion).  Itchy, runny nose with sneezing and tearing of the eyes. DIAGNOSIS Your health care provider can help you determine the allergen or allergens that trigger your symptoms. If you and your health care provider are unable to determine the allergen, skin or blood testing may be used. Your health care provider will diagnose your condition after taking your health history and performing a physical exam.  Your health care provider may assess you for other related conditions, such as asthma, pink eye, or an ear infection. TREATMENT Allergic rhinitis does not have a cure, but it can be controlled by:  Medicines that block allergy symptoms. These may include allergy shots, nasal sprays, and oral antihistamines.  Avoiding the allergen. Hay fever may often be treated with antihistamines in pill or nasal spray forms. Antihistamines block the effects of histamine. There are over-the-counter medicines that may help with nasal congestion and swelling around the eyes. Check with your health care provider before taking or giving this medicine. If avoiding the allergen or the medicine prescribed do not work, there are many new medicines your health care provider can prescribe. Stronger medicine may be used if initial measures are ineffective. Desensitizing injections can be used if medicine and avoidance does not work. Desensitization is  when a patient is given ongoing shots until the body becomes less sensitive to the allergen. Make sure you follow up with your health care provider if problems continue. HOME CARE INSTRUCTIONS It is not possible to completely avoid allergens, but you can reduce your symptoms by taking steps to limit your exposure to them. It helps to know exactly what you are allergic to so that you can avoid your specific triggers. SEEK MEDICAL CARE IF:  You have a fever.  You develop a cough that does not stop easily (persistent).  You have shortness of breath.  You start wheezing.  Symptoms interfere with normal daily activities.   This information is not intended to replace advice given to you by your health care provider. Make sure you discuss any questions you have with your health care provider.   Document Released: 11/05/2000 Document Revised: 03/03/2014 Document Reviewed: 10/18/2012 Elsevier Interactive Patient Education 2016 [employer].    IF you received an x-ray today, you will receive an invoice from [facility]. Please contact [facility] at [phone] with questions or concerns regarding your invoice.   IF you received labwork today, you will receive an invoice from [employer]. Please contact [facility] at [phone] with questions or concerns regarding your invoice.   Our billing staff will not be able to assist you with questions regarding bills from these companies.  You will be contacted with the lab results as soon as they are available. The fastest way to get your results is to activate your My Chart account. Instructions are located on the last page of this paperwork. If you have not heard from [place] regarding the results in 2 weeks, please contact this office.

## 2015-11-13 NOTE — Progress Notes (Signed)
MRN: [id number] DOB: 12-May-1980  Subjective:   Sydney Sullivan is a 35 y.o. female presenting for chief complaint of Chest Pain; Shortness of Breath; and Dizziness  Reports onset of new onset constant, sharp, non-radiating left-sided chest pain this morning at 10am. Her chest pain is associated with dizziness, shob; she also felt feverish, head pressure last night. She was not performing any strenuous activity this morning when her chest pain started. She has not tried any medications for relief. Of note, patient does admit congestion, sore throat, has longstanding history of this, does not take any allergy medication. Also had a dry cough earlier in the week. Denies fever, chest tightness, wheezing, n/v, abdominal pain, diaphoresis, neck pain, back pain. Denies smoking cigarettes or drinking alcohol.   September has a current medication list which includes the following prescription(s): albuterol. Also is allergic to aspirin.  Sydney Sullivan  has a past medical history of Abnormal Pap smear; Allergy; Ankle fracture, right; Anxiety; Asthma; Depression; Headache(784.0); Heart palpitations; Hypoglycemia; Ovarian cyst; and Tailbone injury. Also  has a past surgical history that includes Cesarean section; Tubal ligation; Laparoscopic ovarian cystectomy; and Breast lumpectomy.  Objective:   Vitals: BP 124/80 (BP Location: Right Arm, Patient Position: Sitting, Cuff Size: Normal)   Pulse 82   Temp 98.3 F (36.8 C) (Oral)   Resp 17   Ht 4' 10.5" (1.486 m)   Wt 144 lb (65.3 kg)   LMP 11/12/2015 (Approximate)   SpO2 100%   BMI 29.58 kg/m   Physical Exam  Constitutional: She is oriented to person, place, and time. She appears well-developed and well-nourished.  HENT:  Mouth/Throat: Oropharynx is clear and moist.  Eyes: Pupils are equal, round, and reactive to light. No scleral icterus.  Cardiovascular: Normal rate, regular rhythm and intact distal pulses.  Exam reveals no gallop and no  friction rub.   No murmur heard. Pulmonary/Chest: No respiratory distress. She has no wheezes. She has no rales.  Abdominal: Soft. Bowel sounds are normal. She exhibits no distension and no mass. There is tenderness (RLQ-mid abdomen). There is no guarding.  Musculoskeletal: She exhibits no edema.  Neurological: She is alert and oriented to person, place, and time.  Skin: Skin is warm and dry.   ECG interpretation - normal sinus rhythm.  Dg Chest 2 View  Result Date: 11/13/2015 CLINICAL DATA:  Chest pain and shortness of breath. EXAM: CHEST  2 VIEW COMPARISON:  04/20/2015 FINDINGS: Dense 4 mm left mid lung nodule, no change from 07/25/2014, most likely calcified granuloma. Airway thickening is present, suggesting bronchitis or reactive airways disease. Cardiac and mediastinal margins appear normal. No pleural effusion. IMPRESSION: 1. Airway thickening is present, suggesting bronchitis or reactive airways disease. Electronically Signed   By: Van Clines M.D.   On: 11/13/2015 15:06   Results for orders placed or performed in visit on 11/13/15 (from the past 24 hour(s))  POCT CBC     Status: Abnormal   Collection Time: 11/13/15  3:20 PM  Result Value Ref Range   WBC 7.2 4.6 - 10.2 K/uL   Lymph, poc 2.4 0.6 - 3.4   POC LYMPH PERCENT 33.5 10 - 50 %L   MID (cbc) 0.7 0 - 0.9   POC MID % 9.3 0 - 12 %M   POC Granulocyte 4.1 2 - 6.9   Granulocyte percent 57.2 37 - 80 %G   RBC 4.68 4.04 - 5.48 M/uL   Hemoglobin 12.7 12.2 - 16.2 g/dL   HCT, POC 37.4 (A)  37.7 - 47.9 %   MCV 79.9 (A) 80 - 97 fL   MCH, POC 27.1 27 - 31.2 pg   MCHC 33.9 31.8 - 35.4 g/dL   RDW, POC 15.7 %   Platelet Count, POC 245 142 - 424 K/uL   MPV 8.8 0 - 99.8 fL   Assessment and Plan :   1. Acute bronchitis, unspecified organism 2. Left sided chest pain 3. Shortness of breath 4. Dizziness - Patient has minimal risk factors for ACS. This together with physical exam findings, ecg findings, x-ray report and  significant improvement with 2 nebulizer treatments, we agreed to order stat troponin without sending her to the ED for now. She will start Azithromycin to address bronchitis. Patient is aware that if troponin is positive she will have to report to ED immediately.  5. Extrinsic asthma, unspecified asthma severity, uncomplicated - Schedule albuterol inhaler/nebulizer treatments. Use short steroid course in addition.   6. Allergic rhinitis, unspecified allergic rhinitis type - Start aggressive allergy management.   Jaynee Eagles, PA-C [facility] [phone] 11/13/2015 2:39 PM

## 2015-11-14 ENCOUNTER — Encounter: Payer: Self-pay | Admitting: Urgent Care

## 2015-11-14 LAB — COMPLETE METABOLIC PANEL WITH GFR
ALBUMIN: 4.3 g/dL (ref 3.6–5.1)
ALT: 18 U/L (ref 6–29)
AST: 19 U/L (ref 10–30)
Alkaline Phosphatase: 63 U/L (ref 33–115)
BUN: 13 mg/dL (ref 7–25)
CALCIUM: 9.8 mg/dL (ref 8.6–10.2)
CO2: 30 mmol/L (ref 20–31)
CREATININE: 0.63 mg/dL (ref 0.50–1.10)
Chloride: 102 mmol/L (ref 98–110)
GFR, Est African American: >89 mL/min (ref >=60)
Glucose, Bld: 107 mg/dL — ABNORMAL HIGH (ref 65–99)
Potassium: 4.3 mmol/L (ref 3.5–5.3)
Sodium: 139 mmol/L (ref 135–146)
Total Bilirubin: 0.2 mg/dL (ref 0.2–1.2)
Total Protein: 7.1 g/dL (ref 6.1–8.1)

## 2015-11-14 LAB — TROPONIN I: Troponin I: <0.01 ng/mL (ref <=0.05)

## 2015-11-30 ENCOUNTER — Ambulatory Visit (INDEPENDENT_AMBULATORY_CARE_PROVIDER_SITE_OTHER): Payer: Commercial Indemnity | Admitting: Obstetrics & Gynecology

## 2015-11-30 ENCOUNTER — Encounter: Payer: Self-pay | Admitting: Obstetrics & Gynecology

## 2015-11-30 VITALS — BP 127/74 | HR 82 | Wt 144.0 lb

## 2015-11-30 DIAGNOSIS — Z1151 Encounter for screening for human papillomavirus (HPV): Secondary | ICD-10-CM | POA: Diagnosis not present

## 2015-11-30 DIAGNOSIS — R109 Unspecified abdominal pain: Secondary | ICD-10-CM | POA: Insufficient documentation

## 2015-11-30 DIAGNOSIS — Z01419 Encounter for gynecological examination (general) (routine) without abnormal findings: Secondary | ICD-10-CM

## 2015-11-30 DIAGNOSIS — Z124 Encounter for screening for malignant neoplasm of cervix: Secondary | ICD-10-CM

## 2015-11-30 MED ORDER — SACCHAROMYCES BOULARDII 250 MG PO CAPS: 250.0000 mg | ORAL_CAPSULE | Freq: Two times a day (BID) | ORAL | Status: DC

## 2015-11-30 NOTE — Patient Instructions (Signed)
Síndrome del intestino irritable en los adultos  (Irritable Bowel Syndrome, Adult)  El síndrome del intestino irritable (SII) no es una enfermedad específica. Se trata de una serie de síntomas que afectan a los órganos que intervienen en la digestión (tubo gastrointestinal o tubo digestivo).   Para regular la manera en que funciona el tubo digestivo, el cuerpo envía señales entre los intestinos y el cerebro. Si tiene SII, puede haber un problema con estas señales. Como consecuencia, el tubo digestivo no funciona con normalidad. Los intestinos pueden volverse más sensibles y reaccionar de forma exagerada a determinadas cosas. Esto ocurre especialmente cuando se consumen determinados alimentos o cuando se está bajo estrés.   Hay cuatro tipos de síndrome del intestino irritable que pueden determinarse en función de la consistencia de la materia fecal:   · SII con diarrea.  · SII con estreñimiento.  · SII mixto.  · SII no tipificado.  Es importante saber qué tipo de SII es el que se padece. Algunos tratamientos pueden ser más útiles para ciertos tipos de SII.   CAUSAS   Se desconoce la causa exacta del SII.  FACTORES DE RIESGO  Puede tener un riesgo más alto de tener SII si:  · Es mujer.  · Es menor de 45 años.  · Tiene antecedentes familiares de SII.  · Tiene problemas mentales.  · Tuvo una infección bacteriana en el tubo digestivo.  SIGNOS Y SÍNTOMAS   Los síntomas de SII pueden variar de una persona a otra. El síntoma principal es el dolor o el malestar abdominal. Otros síntomas por lo general incluyen uno o más de los siguientes:   · Diarrea, estreñimiento, o ambos.  · Hinchazón abdominal o meteorismo.  · Sensación de saciedad o malestar después de comer poco o una cantidad normal de comida.  · Gases frecuentes.  · Mucosidad en las heces.  · Sensación de tener más heces por eliminar después de defecar.  Los síntomas suelen aparecer y desaparecer. Pueden estar asociados con estrés, enfermedades psiquiátricas o con  nada en absoluto.   DIAGNÓSTICO   No hay un estudio específico para diagnosticar el SII. El médico hará el diagnóstico en función de un examen físico, la historia clínica y los síntomas. Es posible que deban realizarle otros estudios para descartar otras enfermedades que pueden estar causando los síntomas. Estos pueden incluir lo siguiente:   · Análisis de sangre.  · Radiografías.  · Tomografía computarizada.  · Endoscopia y colonoscopia. En este estudio se observa el tubo digestivo con un tubo largo, delgado y flexible.  TRATAMIENTO  No hay cura para el SII, pero el tratamiento puede ayudar a aliviar los síntomas. A menudo, el tratamiento para el SII incluye lo siguiente:   · Cambios en la dieta, por ejemplo:    Consumir más fibra.    Evitar los alimentos que causan síntomas.    Beber más agua.    Consumir porciones normales de comida de tamaño mediano.  · Medicamentos. Estos pueden incluir lo siguiente:    Suplementos de fibra si está estreñido.    Medicamentos para controlar la diarrea (antidiarreicos).    Medicamentos para ayudar a controlar los espasmos musculares del tubo digestivo (antiespasmódicos).    Medicamentos que ayuden con los problemas mentales, como antidepresivos o tranquilizantes.  · Terapia.    La psicoterapia puede ayudar con la ansiedad, la depresión u otros problemas de salud mental que pueden empeorar los síntomas de SII.  · Reducción del estrés.    Controlar el estrés   puede ayudar a mantener los síntomas bajo control.  INSTRUCCIONES PARA EL CUIDADO EN EL HOGAR   · Tome los medicamentos solamente como se lo haya indicado el médico.  · Consuma una dieta saludable.    Evite los alimentos y las bebidas con azúcar agregada.    Incorpore gradualmente en la dieta una mayor cantidad de cereales integrales, frutas y verduras. Esto puede ser especialmente útil si tiene SII con estreñimiento.    Evite los alimentos y las bebidas que intensifican los síntomas. Estos pueden incluir productos lácteos y  bebidas con cafeína o con gas.    No coma comidas abundantes.    Beba suficiente líquido para mantener la orina clara o de color amarillo pálido.  · Haga ejercicios regularmente. Pídale al médico que le recomiende algunas actividades adecuadas para usted.  · Concurra a todas las visitas de control como se lo haya indicado el médico. Esto es importante.  SOLICITE ATENCIÓN MÉDICA SI:   · Tiene dolor permanente.  · Tiene dificultad o dolor al tragar.  · Tiene diarrea que empeora.  SOLICITE ATENCIÓN MÉDICA DE INMEDIATO SI:   · Tiene dolor abdominal intenso y que empeora.  · Tiene diarrea y alguno de los siguientes síntomas:  ¨ Aparece una erupción, rigidez en el cuello o dolor de cabeza intensos.  ¨ Está irritable, somnoliento o le cuesta despertarse.  ¨ Se siente débil, mareado o tiene mucha sed.  · Observa sangre de color rojo brillante en la materia fecal, o las heces son negras y de aspecto alquitranado.  · Tiene hinchazón abdominal atípica que es dolorosa.  · Vomita continuamente.  · Vomita con sangre (hematemesis).  · Tiene dolor abdominal y fiebre.     Esta información no tiene como fin reemplazar el consejo del médico. Asegúrese de hacerle al médico cualquier pregunta que tenga.     Document Released: 02/10/2005 Document Revised: 03/03/2014  Elsevier Interactive Patient Education ©2016 [employer]vier Inc[employer]

## 2015-11-30 NOTE — Addendum Note (Signed)
Addended by: Christiana Pellant A on: 11/30/2015 11:58 AM   Modules accepted: Orders

## 2015-11-30 NOTE — Progress Notes (Signed)
Patient ID: Sydney Sullivan, female   DOB: Oct 24, 1980, 36 y.o.   MRN: [id number]  Chief Complaint  Patient presents with  . Gynecologic Exam  . Pelvic Pain    HPI Sydney Sullivan is a 35 y.o. female.  [id number] Patient's last menstrual period was 11/12/2015 (approximate). Menses are irregular but not heavy. She has intermittent abdominal pain not related to menses, sometimes debilitating, with constipation and episodes of diarrhea. She states that endometriosis was diagnosed by laparoscopy in [place] years ago but had no specific therapy. HPI  Past Medical History:  Diagnosis Date  . Abnormal Pap smear   . Allergy   . Ankle fracture, right   . Anxiety   . Asthma   . Depression   . Headache(784.0)   . Heart palpitations    cardiac workup ok  . Hypoglycemia   . Ovarian cyst   . Tailbone injury    fracture    Past Surgical History:  Procedure Laterality Date  . BREAST LUMPECTOMY     left  . CESAREAN SECTION    . LAPAROSCOPIC OVARIAN CYSTECTOMY    . TUBAL LIGATION      No family history on file.  Social History Social History  Substance Use Topics  . Smoking status: Never Smoker  . Smokeless tobacco: Never Used  . Alcohol use No    Allergies  Allergen Reactions  . Aspirin Anaphylaxis    Current Outpatient Prescriptions  Medication Sig Dispense Refill  . albuterol (PROVENTIL HFA;VENTOLIN HFA) 108 (90 Base) MCG/ACT inhaler Inhale 2 puffs into the lungs every 6 (six) hours as needed for wheezing or shortness of breath (cough, shortness of breath or wheezing.). 1 Inhaler 1  . albuterol (PROVENTIL) (2.5 MG/3ML) 0.083% nebulizer solution Take 3 mLs (2.5 mg total) by nebulization every 6 (six) hours as needed for wheezing or shortness of breath. 150 mL 1  . azithromycin (ZITHROMAX) 250 MG tablet Start with 2 tablets today, then 1 daily thereafter. 6 tablet 0  . cetirizine (ZYRTEC) 10 MG tablet Take 1 tablet (10 mg total) by mouth daily. 90 tablet 3  .  fluticasone (FLONASE) 50 MCG/ACT nasal spray Place 2 sprays into both nostrils daily. 16 g 11  . montelukast (SINGULAIR) 10 MG tablet Take 1 tablet (10 mg total) by mouth at bedtime. 30 tablet 3  . predniSONE (DELTASONE) 20 MG tablet Take 2 tablets (40 mg total) by mouth daily with breakfast. 10 tablet 0   No current facility-administered medications for this visit.     Review of Systems Review of Systems  Constitutional: Negative.   Gastrointestinal: Positive for abdominal distention, abdominal pain, constipation and diarrhea.  Genitourinary: Positive for menstrual problem (irregular). Negative for dyspareunia, frequency, vaginal bleeding and vaginal discharge.    Blood pressure 127/74, pulse 82, weight 144 lb (65.3 kg), last menstrual period 11/12/2015.  Physical Exam Physical Exam  Constitutional: She is oriented to person, place, and time. She appears well-developed. No distress.  Cardiovascular: Normal rate.   Pulmonary/Chest: Effort normal. No respiratory distress.  Breasts: breasts appear normal, no suspicious masses, no skin or nipple changes or axillary nodes. Scar above left nipple.   Genitourinary: Uterus normal. No vaginal discharge found.  Genitourinary Comments: Pelvic exam: normal external genitalia, vulva, vagina, cervix, uterus and adnexa.   Neurological: She is alert and oriented to person, place, and time.  Skin: Skin is warm and dry.  Psychiatric: She has a normal mood and affect. Her behavior is normal.    Data Reviewed Pelvic  and abdominal US result   Assessment    Patient Active Problem List   Diagnosis Date Noted  . Abdominal pain in female 11/30/2015  . Pelvic pain in female 09/15/2013  . Dysmenorrhea 09/15/2013   Well woman exam  Suspect Severe IBS  Plan    Pap sent RTC 3 months GI referral 8 glasses of water daily, daily fiber supplement, daily probiotic       Zian Delair 11/30/2015, 11:39 AM

## 2015-12-03 LAB — CYTOLOGY - PAP

## 2016-01-01 ENCOUNTER — Encounter: Payer: Self-pay | Admitting: Gastroenterology

## 2016-01-30 ENCOUNTER — Ambulatory Visit (INDEPENDENT_AMBULATORY_CARE_PROVIDER_SITE_OTHER): Payer: Managed Care, Other (non HMO) | Admitting: Gastroenterology

## 2016-01-30 ENCOUNTER — Encounter: Payer: Self-pay | Admitting: Gastroenterology

## 2016-01-30 ENCOUNTER — Other Ambulatory Visit (INDEPENDENT_AMBULATORY_CARE_PROVIDER_SITE_OTHER): Payer: Managed Care, Other (non HMO)

## 2016-01-30 VITALS — BP 112/60 | HR 80 | Ht <= 58 in | Wt 142.0 lb

## 2016-01-30 DIAGNOSIS — R1033 Periumbilical pain: Secondary | ICD-10-CM

## 2016-01-30 DIAGNOSIS — R194 Change in bowel habit: Secondary | ICD-10-CM | POA: Diagnosis not present

## 2016-01-30 DIAGNOSIS — R198 Other specified symptoms and signs involving the digestive system and abdomen: Secondary | ICD-10-CM

## 2016-01-30 LAB — TSH: TSH: 0.71 u[IU]/mL (ref 0.35–4.50)

## 2016-01-30 LAB — HEMOGLOBIN: HEMOGLOBIN: 12.5 g/dL (ref 12.0–15.0)

## 2016-01-30 LAB — HEMATOCRIT: HEMATOCRIT: 38 % (ref 36.0–46.0)

## 2016-01-30 NOTE — Progress Notes (Signed)
[facility] Consult Note:  History: Sydney Sullivan 01/30/2016  Referring physician: PROVIDER NOT IN SYSTEM  Reason for consult/chief complaint: Abdominal Cramping (generalized) and Constipation (alternating with diarrhea)   Subjective  HPI:  This is a 35 year old Latina woman referred for chronic abdominal pain and diarrhea. She was seen in the presence of her husband and with a Spanish interpreter. She describes at least a year of intermittent crampy lower abdominal pain with some bloating that will feel better after she passes a bowel movement. When that occurs, the bowel movements are usually loose and always nonbloody. His a few episodes of month of more severe bloating and cramps that last about 10 minutes. All this has improved, but not resolved, since she has eliminated lactose.  She denies nausea, vomiting, dysphagia or weight loss.  ROS:  Review of Systems  Constitutional: Negative for appetite change and unexpected weight change.  HENT: Negative for mouth sores and voice change.   Eyes: Negative for pain and redness.  Respiratory: Negative for cough and shortness of breath.   Cardiovascular: Negative for chest pain and palpitations.  Genitourinary: Negative for dysuria and hematuria.  Musculoskeletal: Negative for arthralgias and myalgias.  Skin: Negative for pallor and rash.  Neurological: Negative for weakness and headaches.  Hematological: Negative for adenopathy.     Past Medical History: Past Medical History:  Diagnosis Date  . Abnormal Pap smear   . Allergy   . Ankle fracture, right   . Anxiety   . Asthma   . Depression   . Headache(784.0)   . Heart palpitations    cardiac workup ok  . Hypoglycemia   . Ovarian cyst   . Tailbone injury    fracture     Past Surgical History: Past Surgical History:  Procedure Laterality Date  . ABSCESS DRAINAGE Left    leg  . BREAST LUMPECTOMY Left   . CESAREAN SECTION     x 2  .  LAPAROSCOPIC OVARIAN CYSTECTOMY     x 3  . TUBAL LIGATION       Family History: Family History  Problem Relation Age of Onset  . Diabetes Father   . Kidney cancer Father   . Prostate cancer Maternal Uncle     x 2  . Kidney disease Maternal Uncle   . Cancer Maternal Uncle     type unknown  . Diabetes Maternal Uncle   . Diabetes Paternal Aunt   . Diabetes Paternal Uncle   . Diabetes Maternal Aunt     Social History: Social History   Social History  . Marital status: Married    Spouse name: N/A  . Number of children: 2  . Years of education: N/A   Social History Main Topics  . Smoking status: Never Smoker  . Smokeless tobacco: Never Used  . Alcohol use No  . Drug use: No  . Sexual activity: Yes    Birth control/ protection: Surgical   Other Topics Concern  . None   Social History Narrative  . None    Allergies: Allergies  Allergen Reactions  . Aspirin Anaphylaxis    Outpatient Meds: Current Outpatient Prescriptions  Medication Sig Dispense Refill  . albuterol (PROVENTIL HFA;VENTOLIN HFA) 108 (90 Base) MCG/ACT inhaler Inhale 2 puffs into the lungs every 6 (six) hours as needed for wheezing or shortness of breath (cough, shortness of breath or wheezing.). 1 Inhaler 1  . albuterol (PROVENTIL) (2.5 MG/3ML) 0.083% nebulizer solution Take 3 mLs (2.5 mg total) by nebulization every  6 (six) hours as needed for wheezing or shortness of breath. 150 mL 1  . cetirizine (ZYRTEC) 10 MG tablet Take 1 tablet (10 mg total) by mouth daily. (Patient taking differently: Take 10 mg by mouth as needed. ) 90 tablet 3  . fluticasone (FLONASE) 50 MCG/ACT nasal spray Place 2 sprays into both nostrils daily. (Patient taking differently: Place 2 sprays into both nostrils as needed. ) 16 g 11  . montelukast (SINGULAIR) 10 MG tablet Take 1 tablet (10 mg total) by mouth at bedtime. (Patient taking differently: Take 10 mg by mouth as needed. ) 30 tablet 3   Current Facility-Administered  Medications  Medication Dose Route Frequency Provider Last Rate Last Dose  . saccharomyces boulardii (FLORASTOR) capsule 250 mg  250 mg Oral BID Woodroe Mode, MD          ___________________________________________________________________ Objective   Exam:  BP 112/60 (BP Location: Left Arm, Patient Position: Sitting, Cuff Size: Normal)   Pulse 80   Ht 4' 9.28" (1.455 m) Comment: height measured without shoes  Wt 142 lb (64.4 kg)   LMP 01/12/2016   BMI 30.43 kg/m    General: this is a(n) Overweight and well-appearing  woman   Eyes: sclera anicteric, no redness  ENT: oral mucosa moist without lesions, no cervical or supraclavicular lymphadenopathy, good dentition  CV: RRR without murmur, S1/S2, no JVD, no peripheral edema  Resp: clear to auscultation bilaterally, normal RR and effort noted  GI: soft, no tenderness, with active bowel sounds. No guarding or palpable organomegaly noted.  Skin; warm and dry, no rash or jaundice noted  Neuro: awake, alert and oriented x 3. Normal gross motor function and fluent speech  Assessment: Encounter Diagnoses  Name Primary?  . Periumbilical pain Yes  . Altered bowel function     It sounds most like underlying IBS with lactose intolerance. Obstruction seems much less likely. There are no red flag symptoms.   Plan:  Dietary instructions given I do not think antispasmodic therapy is likely to be helpful since the episodes are short lived. I recommended a heating pad for the more severe episodes. Hemoglobin/hematocrit and TSH  No current plans for endoscopic workup See me as needed  Thank you for the courtesy of this consult.  Please call me with any questions or concerns.  Nelida Meuse III  CC: PROVIDER NOT IN SYSTEM

## 2016-01-30 NOTE — Patient Instructions (Addendum)
Food Guidelines for a sensitive stomach  Many people have difficulty digesting certain foods, causing a variety of distressing and embarrassing symptoms such as abdominal pain, bloating and gas.  These foods may need to be avoided or consumed in small amounts.  Here are some tips that might be helpful for you.  1.   Lactose intolerance is the difficulty or complete inability to digest lactose, the natural sugar in milk and anything made from milk.  This condition is harmless, common, and can begin any time during life.  Some people can digest a modest amount of lactose while others cannot tolerate any.  Also, not all dairy products contain equal amounts of lactose.  For example, hard cheeses such as parmesan have less lactose than soft cheeses such as cheddar.  Yogurt has less lactose than milk or cheese.  Many packaged foods (even many brands of bread) have milk, so read ingredient lists carefully.  It is difficult to test for lactose intolerance, so just try avoiding lactose as much as possible for a week and see what happens with your symptoms.  If you seem to be lactose intolerant, the best plan is to avoid it (but make sure you get calcium from another source).  The next best thing is to use lactase enzyme supplements, available over the counter everywhere.  Just know that many lactose intolerant people need to take several tablets with each serving of dairy to avoid symptoms.  Lastly, a lot of restaurant food is made with milk or butter.  Many are things you might not suspect, such as mashed potatoes, rice and pasta (cooked with butter) and "grilled" items.  If you are lactose intolerant, it never hurts to ask your server what has milk or butter.  2.   Fiber is an important part of your diet, but not all fiber is well-tolerated.  Insoluble fiber such as bran is often consumed by normal gut bacteria and converted into gas.  Soluble fiber such as oats, squash, carrots and green beans are typically  tolerated better.  3.   Some types of carbohydrates can be poorly digested.  Examples include: fructose (apples, cherries, pears, raisins and other dried fruits), fructans (onions, zucchini, large amounts of wheat), sorbitol/mannitol/xylitol and sucralose/Splenda (common artificial sweeteners), and raffinose (lentils, broccoli, cabbage, asparagus, brussel sprouts, many types of beans).  Do a Development worker, community for [employer] and you will find helpful information. Beano, a dietary supplement, will often help with raffinose-containing foods.  As with lactase tablets, you may need several per serving.  4.   Whenever possible, avoid processed food&meats and chemical additives.  High fructose corn syrup, a common sweetener, may be difficult to digest.  Eggs and soy (comes from the soybean, and added to many foods now) are the other most common bloating/gassy foods.  - Dr. [facility]   Also, I recommend that you stop taking the probiotic, because it may make bloating and gas worse.  If you are age 36 or older, your body mass index should be between 23-30. Your Body mass index is 30.43 kg/m. If this is out of the aforementioned range listed, please consider follow up with your Primary Care Provider.  If you are age 36 or younger, your body mass index should be between 19-25. Your Body mass index is 30.43 kg/m. If this is out of the aformentioned range listed, please consider follow up with your Primary Care Provider.   Your physician has requested that you go to the  basement for the lab work before leaving today.  Thank you for choosing  GI  Dr Wilfrid Lund III

## 2016-05-30 IMAGING — CR DG CHEST 2V
2 series · 2 of 2 positions shown · non-contrast
Comparison: None.

CLINICAL DATA: Chest pain, shortness of breath.

EXAM:
CHEST  2 VIEW

[w chest lat]
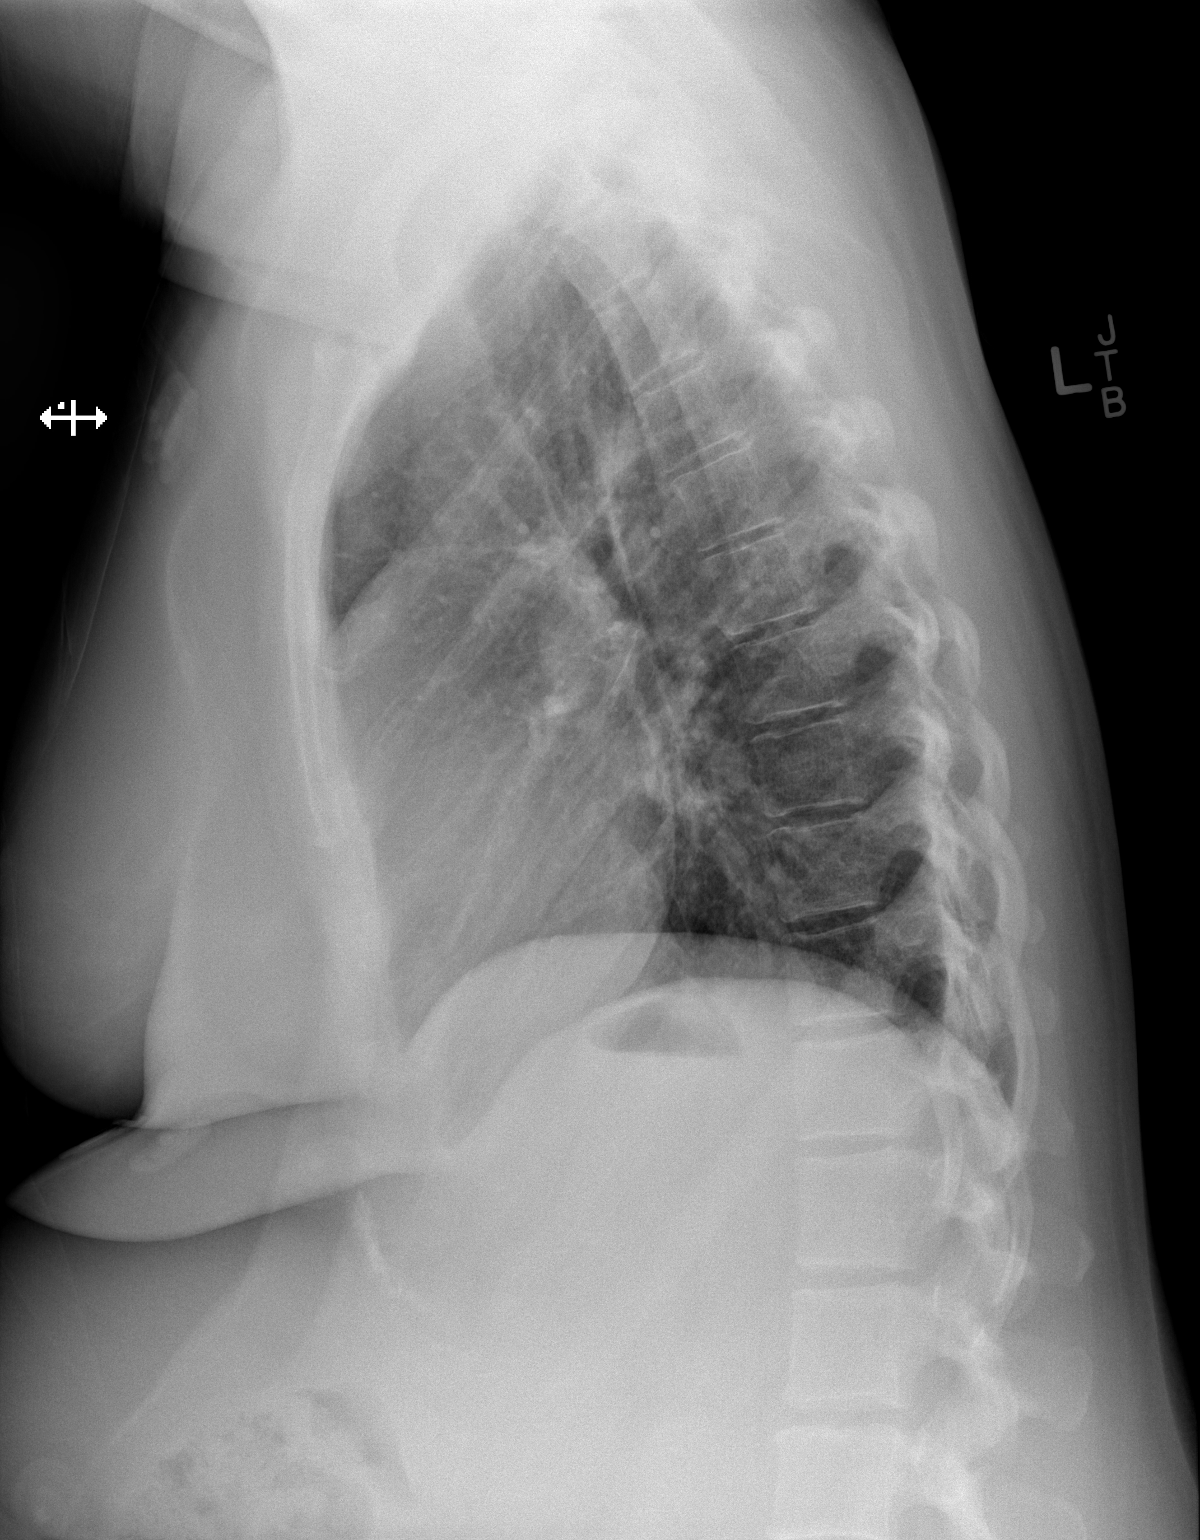

[x chest ap]
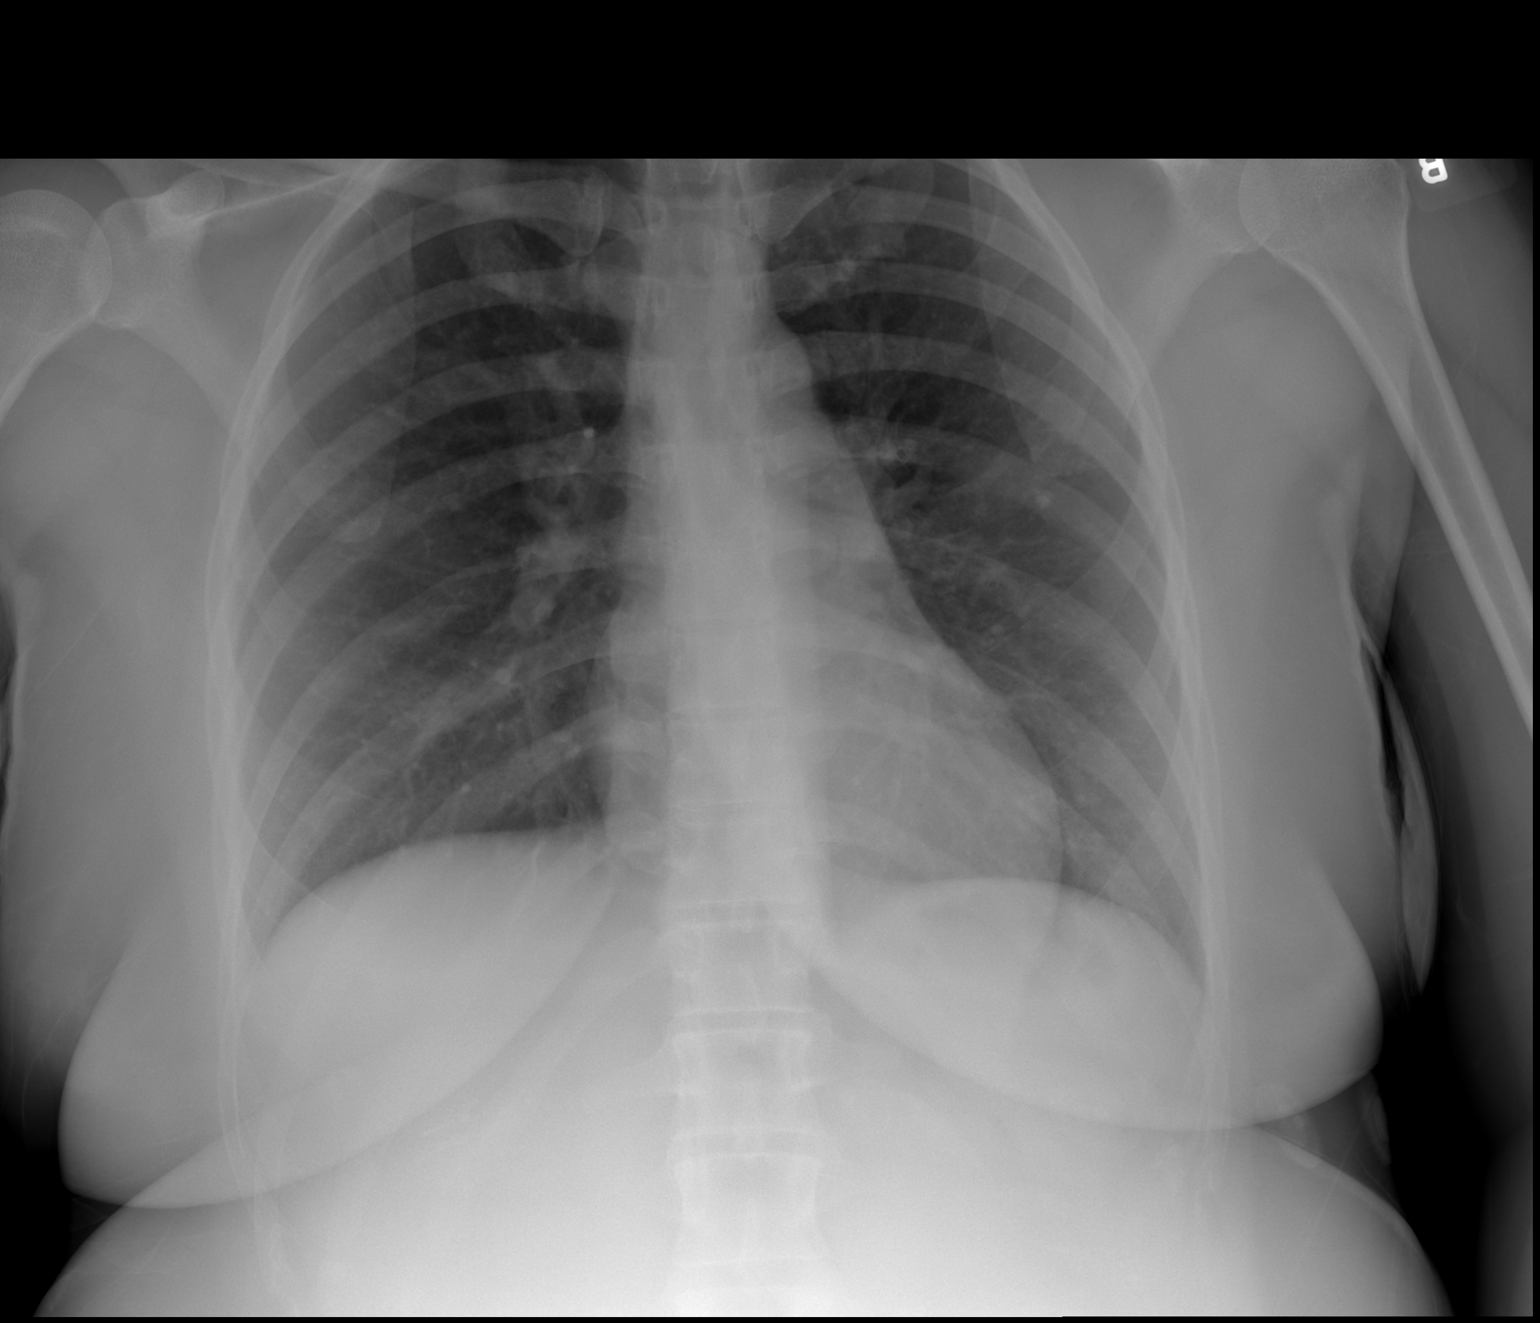

[2 of 2 positions shown; findings below may reference images not displayed]

FINDINGS: The heart size and mediastinal contours are within normal limits.
Both lungs are clear. No pneumothorax or pleural effusion is noted.
The visualized skeletal structures are unremarkable.
IMPRESSION: No active cardiopulmonary disease.

## 2016-08-02 ENCOUNTER — Encounter: Payer: Self-pay | Admitting: Physician Assistant

## 2016-08-02 ENCOUNTER — Ambulatory Visit (INDEPENDENT_AMBULATORY_CARE_PROVIDER_SITE_OTHER): Payer: Managed Care, Other (non HMO) | Admitting: Physician Assistant

## 2016-08-02 VITALS — BP 117/79 | HR 89 | Temp 98.6°F | Resp 18 | Ht <= 58 in | Wt 143.0 lb

## 2016-08-02 DIAGNOSIS — Z131 Encounter for screening for diabetes mellitus: Secondary | ICD-10-CM | POA: Diagnosis not present

## 2016-08-02 DIAGNOSIS — J029 Acute pharyngitis, unspecified: Secondary | ICD-10-CM

## 2016-08-02 DIAGNOSIS — R062 Wheezing: Secondary | ICD-10-CM

## 2016-08-02 LAB — POCT CBC
GRANULOCYTE PERCENT: 62.5 % (ref 37–80)
HCT, POC: 36.9 % — AB (ref 37.7–47.9)
Hemoglobin: 12.5 g/dL (ref 12.2–16.2)
Lymph, poc: 2.2 (ref 0.6–3.4)
MCH, POC: 27.5 pg (ref 27–31.2)
MCHC: 33.9 g/dL (ref 31.8–35.4)
MCV: 81.2 fL (ref 80–97)
MID (cbc): 0.6 (ref 0–0.9)
MPV: 9.1 fL (ref 0–99.8)
PLATELET COUNT, POC: 180 10*3/uL (ref 142–424)
POC Granulocyte: 4.6 (ref 2–6.9)
POC LYMPH %: 29.5 % (ref 10–50)
POC MID %: 8 %M (ref 0–12)
RBC: 4.55 M/uL (ref 4.04–5.48)
RDW, POC: 15.4 %
WBC: 7.4 10*3/uL (ref 4.6–10.2)

## 2016-08-02 LAB — POCT RAPID STREP A (OFFICE): RAPID STREP A SCREEN: NEGATIVE

## 2016-08-02 LAB — POCT GLYCOSYLATED HEMOGLOBIN (HGB A1C): HEMOGLOBIN A1C: 5.8

## 2016-08-02 MED ORDER — ALBUTEROL SULFATE (2.5 MG/3ML) 0.083% IN NEBU
2.5000 mg | INHALATION_SOLUTION | Freq: Once | RESPIRATORY_TRACT | Status: AC
  Administered 2016-08-02: 2.5 mg via RESPIRATORY_TRACT

## 2016-08-02 MED ORDER — METHYLPREDNISOLONE ACETATE 80 MG/ML IJ SUSP
80.0000 mg | Freq: Once | INTRAMUSCULAR | Status: AC
  Administered 2016-08-02: 80 mg via INTRAMUSCULAR

## 2016-08-02 MED ORDER — IPRATROPIUM BROMIDE 0.02 % IN SOLN
0.5000 mg | Freq: Once | RESPIRATORY_TRACT | Status: AC
  Administered 2016-08-02: 0.5 mg via RESPIRATORY_TRACT

## 2016-08-02 MED ORDER — CETIRIZINE HCL 10 MG PO TABS: 10.0000 mg | ORAL_TABLET | Freq: Every day | ORAL | 11 refills | Status: AC

## 2016-08-02 MED ORDER — ALBUTEROL SULFATE (2.5 MG/3ML) 0.083% IN NEBU: 2.5000 mg | INHALATION_SOLUTION | Freq: Four times a day (QID) | RESPIRATORY_TRACT | 1 refills | Status: AC | PRN

## 2016-08-02 NOTE — Progress Notes (Signed)
08/02/2016 2:42 PM   DOB: 1980-09-08 / MRN: [id number]  SUBJECTIVE:  Sydney Sullivan is a 36 y.o. female presenting for sore throat that started 6 days ago.  She can not describe the pain but the pain is worse when she swallowing. She associates cough, subjective fever. She is getting worse.  She denies rash.  She is drinking and eating.  She has tried several gargles without relief.   She is allergic to aspirin.   She  has a past medical history of Abnormal Pap smear; Allergy; Ankle fracture, right; Anxiety; Asthma; Depression; Headache(784.0); Heart palpitations; Hypoglycemia; Ovarian cyst; and Tailbone injury.    She  reports that she has never smoked. She has never used smokeless tobacco. She reports that she does not drink alcohol or use drugs. She  reports that she currently engages in sexual activity. She reports using the following method of birth control/protection: Surgical. The patient  has a past surgical history that includes Cesarean section; Tubal ligation; Laparoscopic ovarian cystectomy; Breast lumpectomy (Left); and Abscess drainage (Left).  Her family history includes Cancer in her maternal uncle; Diabetes in her father, maternal aunt, maternal uncle, paternal aunt, and paternal uncle; Kidney cancer in her father; Kidney disease in her maternal uncle; Prostate cancer in her maternal uncle.  Review of Systems  Constitutional: Positive for chills, diaphoresis, fever and malaise/fatigue.  HENT: Positive for congestion, ear pain and sore throat. Negative for hearing loss and tinnitus.   Respiratory: Positive for cough, sputum production, shortness of breath and wheezing. Negative for hemoptysis.   Cardiovascular: Negative for orthopnea and leg swelling.  Gastrointestinal: Negative for abdominal pain, nausea and vomiting.  Genitourinary: Negative.   Musculoskeletal: Positive for myalgias. Negative for falls.  Skin: Negative for itching and rash.  Neurological: Positive  for dizziness and headaches. Negative for speech change and focal weakness.    The problem list and medications were reviewed and updated by myself where necessary and exist elsewhere in the encounter.   OBJECTIVE:  BP 117/79   Pulse 89   Temp 98.6 F (37 C) (Oral)   Resp 18   Ht 4\' 10"  (1.473 m)   Wt 143 lb (64.9 kg)   LMP 07/13/2016   SpO2 100%   BMI 29.89 kg/m   Physical Exam  Constitutional: She is oriented to person, place, and time. She is active.  Non-toxic appearance.  HENT:  Right Ear: Hearing, tympanic membrane, external ear and ear canal normal.  Left Ear: Hearing, tympanic membrane, external ear and ear canal normal.  Nose: Mucosal edema present. Right sinus exhibits no maxillary sinus tenderness and no frontal sinus tenderness. Left sinus exhibits no maxillary sinus tenderness and no frontal sinus tenderness.  Mouth/Throat: Uvula is midline, oropharynx is clear and moist and mucous membranes are normal. Mucous membranes are not dry. No oropharyngeal exudate, posterior oropharyngeal edema or tonsillar abscesses.  Cardiovascular: Normal rate, regular rhythm, S1 normal, S2 normal, normal heart sounds and intact distal pulses.  Exam reveals no gallop, no friction rub and no decreased pulses.   No murmur heard. Pulmonary/Chest: Effort normal. No stridor. No tachypnea. No respiratory distress. She has wheezes (minimall, end expiratory, resolved with nebulizer treatm). She has no rales.  Abdominal: She exhibits no distension.  Musculoskeletal: She exhibits no edema.  Lymphadenopathy:       Head (right side): No submandibular and no tonsillar adenopathy present.       Head (left side): No submandibular and no tonsillar adenopathy present.  She has no cervical adenopathy.  Neurological: She is alert and oriented to person, place, and time. No cranial nerve deficit. She exhibits normal muscle tone. Coordination normal.  Skin: Skin is warm and dry. She is not diaphoretic. No  pallor.    Results for orders placed or performed in visit on 08/02/16 (from the past 72 hour(s))  POCT rapid strep A     Status: None   Collection Time: 08/02/16  2:09 PM  Result Value Ref Range   Rapid Strep A Screen Negative Negative  POCT glycosylated hemoglobin (Hb A1C)     Status: None   Collection Time: 08/02/16  2:22 PM  Result Value Ref Range   Hemoglobin A1C 5.8   POCT CBC     Status: Abnormal   Collection Time: 08/02/16  2:22 PM  Result Value Ref Range   WBC 7.4 4.6 - 10.2 K/uL   Lymph, poc 2.2 0.6 - 3.4   POC LYMPH PERCENT 29.5 10 - 50 %L   MID (cbc) 0.6 0 - 0.9   POC MID % 8.0 0 - 12 %M   POC Granulocyte 4.6 2 - 6.9   Granulocyte percent 62.5 37 - 80 %G   RBC 4.55 4.04 - 5.48 M/uL   Hemoglobin 12.5 12.2 - 16.2 g/dL   HCT, POC 36.9 (A) 37.7 - 47.9 %   MCV 81.2 80 - 97 fL   MCH, POC 27.5 27 - 31.2 pg   MCHC 33.9 31.8 - 35.4 g/dL   RDW, POC 15.4 %   Platelet Count, POC 180 142 - 424 K/uL   MPV 9.1 0 - 99.8 fL    No results found.  ASSESSMENT AND PLAN:  Sydney Sullivan was seen today for shortness of breath and sore throat.  Diagnoses and all orders for this visit:  Sore throat: Somewhat pan positive female here today with asthma and allergies. Work up reassuring. She has a history of allergies.  She needs to take her zyrtec daily.  I have given her more nebs and the steroid should help with allergies and asthma.  -     POCT CBC -     POCT rapid strep A -     Culture, Group A Strep -     cetirizine (ZYRTEC) 10 MG tablet; Take 1 tablet (10 mg total) by mouth daily. Do not miss doses. -     methylPREDNISolone acetate (DEPO-MEDROL) injection 80 mg; Inject 1 mL (80 mg total) into the muscle once.  Wheezing -     albuterol (PROVENTIL) (2.5 MG/3ML) 0.083% nebulizer solution 2.5 mg; Take 3 mLs (2.5 mg total) by nebulization once. -     ipratropium (ATROVENT) nebulizer solution 0.5 mg; Take 2.5 mLs (0.5 mg total) by nebulization once. -     methylPREDNISolone acetate  (DEPO-MEDROL) injection 80 mg; Inject 1 mL (80 mg total) into the muscle once. -     albuterol (PROVENTIL) (2.5 MG/3ML) 0.083% nebulizer solution; Take 3 mLs (2.5 mg total) by nebulization every 6 (six) hours as needed for wheezing or shortness of breath. -     albuterol (PROVENTIL) (2.5 MG/3ML) 0.083% nebulizer solution 2.5 mg; Take 3 mLs (2.5 mg total) by nebulization once.  Screening for diabetes mellitus -     POCT glycosylated hemoglobin (Hb A1C)    The patient is advised to call or return to clinic if she does not see an improvement in symptoms, or to seek the care of the closest emergency department if she worsens with  the above plan.   Philis Fendt, MHS, PA-C [facility] 08/02/2016 2:42 PM

## 2016-08-02 NOTE — Patient Instructions (Addendum)
Do not miss doses of your zyrtec.     IF you received an x-ray today, you will receive an invoice from [facility]. Please contact [facility] at [phone] with questions or concerns regarding your invoice.   IF you received labwork today, you will receive an invoice from [facility]. Please contact [facility] at [phone] with questions or concerns regarding your invoice.   Our billing staff will not be able to assist you with questions regarding bills from these companies.  You will be contacted with the lab results as soon as they are available. The fastest way to get your results is to activate your My Chart account. Instructions are located on the last page of this paperwork. If you have not heard from [place] regarding the results in 2 weeks, please contact this office.

## 2016-08-06 ENCOUNTER — Other Ambulatory Visit: Payer: Self-pay | Admitting: Physician Assistant

## 2016-08-06 ENCOUNTER — Telehealth: Payer: Self-pay

## 2016-08-06 DIAGNOSIS — J020 Streptococcal pharyngitis: Secondary | ICD-10-CM

## 2016-08-06 LAB — CULTURE, GROUP A STREP

## 2016-08-06 MED ORDER — AMOXICILLIN 875 MG PO TABS: 875.0000 mg | ORAL_TABLET | Freq: Two times a day (BID) | ORAL | 0 refills | Status: DC

## 2016-08-06 NOTE — Telephone Encounter (Signed)
Call placed to patient regarding lab results. No answer on patient phone. Left message for patient to return call to this office/ [facility]

## 2016-08-06 NOTE — Telephone Encounter (Signed)
-----   Message from Tereasa Coop, PA-C sent at 08/06/2016  6:03 PM EDT ----- Please give her a call.  I am sending an antibiotic to the pharmacy now. Philis Fendt, MS, PA-C 6:01 PM, 08/06/2016

## 2016-08-07 ENCOUNTER — Telehealth: Payer: Self-pay

## 2016-08-07 NOTE — Telephone Encounter (Signed)
-----   Message from Tereasa Coop, PA-C sent at 08/06/2016  6:04 PM EDT ----- Please relay positive strep to [facility].  If her throat feels better she does not need abx but med is waiting if her throat still hurts. Philis Fendt, MS, PA-C 6:04 PM, 08/06/2016

## 2016-08-07 NOTE — Telephone Encounter (Signed)
Second call placed to patient to make her aware of results and medication. No answer on patient phone. Left message for patient to return call to this office./ [facility]

## 2016-08-13 ENCOUNTER — Telehealth: Payer: Self-pay

## 2016-08-13 NOTE — Telephone Encounter (Signed)
Third call placed to patient to make her aware of lab results. No answer on patient phone, left message for patient to return call to this office./ [facility]

## 2016-08-20 NOTE — Telephone Encounter (Signed)
Letter mailed to patient at this time./ S.Amaliya Whitelaw,CMA

## 2016-09-01 ENCOUNTER — Telehealth: Payer: Self-pay | Admitting: Physician Assistant

## 2016-09-01 NOTE — Telephone Encounter (Signed)
Informed pt of result. Pt no longer having pain but still has inflammation. Encouraged to pick up antibiotics from pharmacy.

## 2016-09-01 NOTE — Telephone Encounter (Signed)
Pt husband is calling to get lab results   Best number [phone]

## 2016-11-12 ENCOUNTER — Encounter: Payer: Self-pay | Admitting: Urgent Care

## 2016-11-12 ENCOUNTER — Ambulatory Visit (INDEPENDENT_AMBULATORY_CARE_PROVIDER_SITE_OTHER): Payer: Managed Care, Other (non HMO)

## 2016-11-12 ENCOUNTER — Ambulatory Visit (INDEPENDENT_AMBULATORY_CARE_PROVIDER_SITE_OTHER): Payer: Managed Care, Other (non HMO) | Admitting: Urgent Care

## 2016-11-12 VITALS — BP 123/78 | HR 95 | Temp 98.5°F | Resp 17 | Ht <= 58 in | Wt 143.0 lb

## 2016-11-12 DIAGNOSIS — M542 Cervicalgia: Secondary | ICD-10-CM | POA: Diagnosis not present

## 2016-11-12 DIAGNOSIS — M792 Neuralgia and neuritis, unspecified: Secondary | ICD-10-CM

## 2016-11-12 MED ORDER — CELECOXIB 100 MG PO CAPS: 100.0000 mg | ORAL_CAPSULE | Freq: Two times a day (BID) | ORAL | 1 refills | Status: AC
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
## 2016-11-12 NOTE — Patient Instructions (Addendum)
[place] at the [facility] in [place] [phone] Please call Orvan July, Office Manager to set up an appointment    Cervical Radiculopathy Cervical radiculopathy happens when a nerve in the neck (cervical nerve) is pinched or bruised. This condition can develop because of an injury or as part of the normal aging process. Pressure on the cervical nerves can cause pain or numbness that runs from the neck all the way down into the arm and fingers. Usually, this condition gets better with rest. Treatment may be needed if the condition does not improve. What are the causes? This condition may be caused by:  Injury.  Slipped (herniated) disk.  Muscle tightness in the neck because of overuse.  Arthritis.  Breakdown or degeneration in the bones and joints of the spine (spondylosis) due to aging.  Bone spurs that may develop near the cervical nerves.  What are the signs or symptoms? Symptoms of this condition include:  Pain that runs from the neck to the arm and hand. The pain can be severe or irritating. It may be worse when the neck is moved.  Numbness or weakness in the affected arm and hand.  How is this diagnosed? This condition may be diagnosed based on symptoms, medical history, and a physical exam. You may also have tests, including:  X-rays.  CT scan.  MRI.  Electromyogram (EMG).  Nerve conduction tests.  How is this treated? In many cases, treatment is not needed for this condition. With rest, the condition usually gets better over time. If treatment is needed, options may include:  Wearing a soft neck collar for short periods of time.  Physical therapy to strengthen your neck muscles.  Medicines, such as NSAIDs, oral corticosteroids, or spinal injections.  Surgery. This may be needed if other treatments do not help. Various types of surgery may be done depending on the cause of your problems.  Follow these instructions at home: Managing  pain  Take over-the-counter and prescription medicines only as told by your health care provider.  If directed, apply ice to the affected area. ? Put ice in a plastic bag. ? Place a towel between your skin and the bag. ? Leave the ice on for 20 minutes, 2-3 times per day.  If ice does not help, you can try using heat. Take a warm shower or warm bath, or use a heat pack as told by your health care provider.  Try a gentle neck and shoulder massage to help relieve symptoms. Activity  Rest as needed. Follow instructions from your health care provider about any restrictions on activities.  Do stretching and strengthening exercises as told by your health care provider or physical therapist. General instructions  If you were given a soft collar, wear it as told by your health care provider.  Use a flat pillow when you sleep.  Keep all follow-up visits as told by your health care provider. This is important. Contact a health care provider if:  Your condition does not improve with treatment. Get help right away if:  Your pain gets much worse and cannot be controlled with medicines.  You have weakness or numbness in your hand, arm, face, or leg.  You have a high fever.  You have a stiff, rigid neck.  You lose control of your bowels or your bladder (have incontinence).  You have trouble with walking, balance, or speaking. This information is not intended to replace advice given to you by your health care provider. Make sure you discuss  any questions you have with your health care provider. Document Released: 11/05/2000 Document Revised: 07/19/2015 Document Reviewed: 04/06/2014 Elsevier Interactive Patient Education  2018 [employer].   Celecoxib capsules What is this medicine? CELECOXIB (sell a KOX ib) is a non-steroidal anti-inflammatory drug (NSAID). This medicine is used to treat arthritis and ankylosing spondylitis. It may be also used for pain or painful monthly  periods. This medicine may be used for other purposes; ask your health care provider or pharmacist if you have questions. COMMON BRAND NAME(S): Celebrex What should I tell my health care provider before I take this medicine? They need to know if you have any of these conditions: -asthma -coronary artery bypass graft (CABG) surgery within the past 2 weeks -drink more than 3 alcohol-containing drinks a day -heart disease or circulation problems like heart failure or leg edema (fluid retention) -high blood pressure -kidney disease -liver disease -stomach bleeding or ulcers -an unusual or allergic reaction to celecoxib, sulfa drugs, aspirin, other NSAIDs, other medicines, foods, dyes, or preservatives -pregnant or trying to get pregnant -breast-feeding How should I use this medicine? Take this medicine by mouth with a full glass of water. Follow the directions on the prescription label. Take it with food if it upsets your stomach or if you take 400 mg at one time. Try to not lie down for at least 10 minutes after you take the medicine. Take the medicine at the same time each day. Do not take more medicine than you are told to take. Long-term, continuous use may increase the risk of heart attack or stroke. A special MedGuide will be given to you by the pharmacist with each prescription and refill. Be sure to read this information carefully each time. Talk to your pediatrician regarding the use of this medicine in children. Special care may be needed. Overdosage: If you think you have taken too much of this medicine contact a poison control center or emergency room at once. NOTE: This medicine is only for you. Do not share this medicine with others. What if I miss a dose? If you miss a dose, take it as soon as you can. If it is almost time for your next dose, take only that dose. Do not take double or extra doses. What may interact with this medicine? Do not take this medicine with any of the  following medications: -cidofovir -methotrexate -other NSAIDs, medicines for pain and inflammation, like ibuprofen or naproxen -pemetrexed This medicine may also interact with the following medications: -alcohol -aspirin and aspirin-like drugs -diuretics -fluconazole -lithium -medicines for high blood pressure -steroid medicines like prednisone or cortisone -warfarin This list may not describe all possible interactions. Give your health care provider a list of all the medicines, herbs, non-prescription drugs, or dietary supplements you use. Also tell them if you smoke, drink alcohol, or use illegal drugs. Some items may interact with your medicine. What should I watch for while using this medicine? Tell your doctor or health care professional if your pain does not get better. Talk to your doctor before taking another medicine for pain. Do not treat yourself. This medicine does not prevent heart attack or stroke. In fact, this medicine may increase the chance of a heart attack or stroke. The chance may increase with longer use of this medicine and in people who have heart disease. If you take aspirin to prevent heart attack or stroke, talk with your doctor or health care professional. Do not take medicines such as ibuprofen and naproxen with  this medicine. Side effects such as stomach upset, nausea, or ulcers may be more likely to occur. Many medicines available without a prescription should not be taken with this medicine. This medicine can cause ulcers and bleeding in the stomach and intestines at any time during treatment. Ulcers and bleeding can happen without warning symptoms and can cause death. What side effects may I notice from receiving this medicine? Side effects that you should report to your doctor or health care professional as soon as possible: -allergic reactions like skin rash, itching or hives, swelling of the face, lips, or tongue -black or bloody stools, blood in the urine  or vomit -blurred vision -breathing problems -chest pain -nausea, vomiting -problems with balance, talking, walking -redness, blistering, peeling or loosening of the skin, including inside the mouth -unexplained weight gain or swelling -unusually weak or tired -yellowing of eyes, skin Side effects that usually do not require medical attention (report to your doctor or health care professional if they continue or are bothersome): -constipation or diarrhea -dizziness -gas or heartburn -upset stomach This list may not describe all possible side effects. Call your doctor for medical advice about side effects. You may report side effects to FDA at 1-800-FDA-1088. Where should I keep my medicine? Keep out of the reach of children. Store at room temperature between 15 and 30 degrees C (59 and 86 degrees F). Keep container tightly closed. Throw away any unused medicine after the expiration date. NOTE: This sheet is a summary. It may not cover all possible information. If you have questions about this medicine, talk to your doctor, pharmacist, or health care provider.  2018 Elsevier/Gold Standard (2009-04-11 10:54:17)   IF you received an x-ray today, you will receive an invoice from [facility]. Please contact [facility] Radiology at [phone] with questions or concerns regarding your invoice.   IF you received labwork today, you will receive an invoice from [facility]. Please contact [facility] at [phone] with questions or concerns regarding your invoice.   Our billing staff will not be able to assist you with questions regarding bills from these companies.  You will be contacted with the lab results as soon as they are available. The fastest way to get your results is to activate your My Chart account. Instructions are located on the last page of this paperwork. If you have not heard from [place] regarding the results in 2 weeks, please contact this office.

## 2016-12-30 ENCOUNTER — Encounter: Payer: Self-pay | Admitting: Urgent Care

## 2016-12-30 ENCOUNTER — Ambulatory Visit (INDEPENDENT_AMBULATORY_CARE_PROVIDER_SITE_OTHER): Payer: Managed Care, Other (non HMO) | Admitting: Urgent Care

## 2016-12-30 VITALS — BP 128/82 | HR 105 | Temp 98.2°F | Resp 18 | Ht <= 58 in | Wt 143.0 lb

## 2016-12-30 DIAGNOSIS — R062 Wheezing: Secondary | ICD-10-CM

## 2016-12-30 DIAGNOSIS — Z9109 Other allergy status, other than to drugs and biological substances: Secondary | ICD-10-CM | POA: Diagnosis not present

## 2016-12-30 DIAGNOSIS — R05 Cough: Secondary | ICD-10-CM | POA: Diagnosis not present

## 2016-12-30 DIAGNOSIS — R0602 Shortness of breath: Secondary | ICD-10-CM

## 2016-12-30 DIAGNOSIS — J45909 Unspecified asthma, uncomplicated: Secondary | ICD-10-CM | POA: Diagnosis not present

## 2016-12-30 DIAGNOSIS — R059 Cough, unspecified: Secondary | ICD-10-CM

## 2016-12-30 MED ORDER — PREDNISONE 20 MG PO TABS: ORAL_TABLET | ORAL | 0 refills | Status: DC

## 2016-12-30 MED ORDER — HYDROCOD POLST-CPM POLST ER 10-8 MG/5ML PO SUER: 5.0000 mL | Freq: Every evening | ORAL | 0 refills | Status: DC | PRN

## 2016-12-30 MED ORDER — LEVALBUTEROL HCL 0.63 MG/3ML IN NEBU
0.6300 mg | INHALATION_SOLUTION | Freq: Once | RESPIRATORY_TRACT | Status: AC
Start: 2016-12-30 — End: 2016-12-30
  Administered 2016-12-30: 0.63 mg via RESPIRATORY_TRACT

## 2016-12-30 MED ORDER — IPRATROPIUM BROMIDE 0.02 % IN SOLN
0.5000 mg | Freq: Once | RESPIRATORY_TRACT | Status: AC
  Administered 2016-12-30: 0.5 mg via RESPIRATORY_TRACT

## 2016-12-30 NOTE — Patient Instructions (Addendum)
  Tos en los adultos (Cough, Adult) La tos ayuda a limpiar la garganta y los pulmones. La tos puede durar solo 2 o 3semanas (aguda) o ms de 8semanas (crnica). Las causas de la tos son [place]. Puede ser el signo de [place] enfermedad o de otro trastorno. CUIDADOS EN EL HOGAR  Est atento a cualquier cambio en la tos.  Tome los medicamentos solamente como se lo haya indicado el mdico. ? Si le recetaron un antibitico, tmelo como se lo haya indicado el mdico. No deje de tomarlo aunque comience a sentirse mejor. ? Hable con el mdico antes de probar un medicamento para la tos.  Beba suficiente lquido para mantener el pis (orina) claro o de color amarillo plido.  Si el aire est seco, use un vaporizador o un humidificador con vapor fro en su casa.  Mantngase alejado de las cosas que lo hacen toser en el trabajo o en su casa.  Si la tos aumenta durante la noche, haga la prueba de usar almohadas adicionales para Theatre manager la cabeza ms elevada mientras duerme.  No fume e intente no estar cerca de humo. Si necesita ayuda para dejar de fumar, consulte al mdico.  No consuma cafena.  No beba alcohol.  Descanse todo lo que sea necesario. SOLICITE AYUDA SI:  Le aparecen problemas (sntomas) nuevos.  Expectora un lquido amarillento (pus) cuando tose.  La tos no mejora despus de 2 o 3semanas, o empeora.  Los medicamentos no [employer] tos, y no Programmer, applications.  Siente un dolor que se vuelve ms intenso o que no se [employer].  Tiene fiebre.  Est bajando de peso y no sabe por qu.  Tiene transpiracin nocturna. SOLICITE AYUDA DE INMEDIATO SI:  Tose y escupe sangre.  Tiene dificultad para respirar.  Los latidos cardacos son muy rpidos. Esta informacin no tiene Marine scientist el consejo del mdico. Asegrese de hacerle al mdico cualquier pregunta que tenga. Document Released: 10/24/2010 Document Revised: 11/01/2014 Document Reviewed:  04/19/2014 Elsevier Interactive Patient Education  2018 [employer].     IF you received an x-ray today, you will receive an invoice from [facility] Radiology. Please contact [facility] Radiology at [phone] with questions or concerns regarding your invoice.   IF you received labwork today, you will receive an invoice from [facility]. Please contact [facility] at [phone] with questions or concerns regarding your invoice.   Our billing staff will not be able to assist you with questions regarding bills from these companies.  You will be contacted with the lab results as soon as they are available. The fastest way to get your results is to activate your My Chart account. Instructions are located on the last page of this paperwork. If you have not heard from [place] regarding the results in 2 weeks, please contact this office.

## 2016-12-30 NOTE — Progress Notes (Signed)
  MRN: [id number] DOB: 05-14-1980  Subjective:   Sydney Sullivan is a 36 y.o. female presenting for 4 day history of productive cough, shob, wheezing, subjective fever, aching, runny nose. Cough has elicited nausea and vomiting, chest discomfort and malaise. She has used nebulized albuterol once every 4 hours, Allegra. Admits that her symptoms started after an allergy flare last week. Denies smoking cigarettes.  Sydney Sullivan has a current medication list which includes the following prescription(s): albuterol, celecoxib, cyclobenzaprine, and cetirizine. Also is allergic to aspirin.  Sydney Sullivan  has a past medical history of Abnormal Pap smear, Allergy, Ankle fracture, right, Anxiety, Asthma, Depression, Headache(784.0), Heart palpitations, Hypoglycemia, Ovarian cyst, and Tailbone injury. Also  has a past surgical history that includes Cesarean section; Tubal ligation; Laparoscopic ovarian cystectomy; Breast lumpectomy (Left); and Abscess drainage (Left).  Objective:   Vitals: Pulse (!) 105   Temp 98.2 F (36.8 C) (Oral)   Resp 18   Ht 4\' 10"  (1.473 m)   Wt 143 lb (64.9 kg)   LMP 12/11/2016   SpO2 99%   BMI 29.89 kg/m   Physical Exam  Constitutional: She is oriented to person, place, and time. She appears well-developed and well-nourished.  HENT:  Mouth/Throat: Oropharynx is clear and moist.  TM's flat but intact and without erythema.  Cardiovascular: Normal rate, regular rhythm and intact distal pulses. Exam reveals no gallop and no friction rub.  No murmur heard. Pulmonary/Chest: No respiratory distress. She has wheezes (mild wheezing mid-lower lung fields). She has no rales.  Lymphadenopathy:    She has no cervical adenopathy.  Neurological: She is alert and oriented to person, place, and time.  Skin: Skin is warm and dry.   Assessment and Plan :   1. Cough 2. Wheezing 3. Shortness of breath 4. Extrinsic asthma, unspecified asthma severity, uncomplicated - Start short  steroid course. Schedule albuterol either inhaled or nebulized. Use cough suppression medication. Return-to-clinic precautions discussed, patient verbalized understanding.   5. Environmental allergies - Maintain Zyrtec or Allegra.  Jaynee Eagles, PA-C [facility] [phone] 12/30/2016  1:54 PM

## 2017-01-09 ENCOUNTER — Ambulatory Visit (INDEPENDENT_AMBULATORY_CARE_PROVIDER_SITE_OTHER): Payer: Managed Care, Other (non HMO) | Admitting: Emergency Medicine

## 2017-01-09 ENCOUNTER — Encounter: Payer: Self-pay | Admitting: Emergency Medicine

## 2017-01-09 VITALS — BP 132/80 | HR 94 | Temp 98.4°F | Resp 17 | Ht <= 58 in | Wt 144.0 lb

## 2017-01-09 DIAGNOSIS — R109 Unspecified abdominal pain: Secondary | ICD-10-CM | POA: Diagnosis not present

## 2017-01-09 NOTE — Progress Notes (Signed)
Sydney Sullivan 36 y.o.   Chief Complaint  Patient presents with  . Abdominal Pain    upper abdominal pain 5 hours     HISTORY OF PRESENT ILLNESS: This is a 36 y.o. female complaining of upper abdominal pain that started today 7 hours ago after eating at [facility]; pain progressively getting worse and now nauseous; no vomiting or diarrhea.    HPI   Prior to Admission medications   Medication Sig Start Date End Date Taking? Authorizing Provider  albuterol (PROVENTIL) (2.5 MG/3ML) 0.083% nebulizer solution Take 3 mLs (2.5 mg total) by nebulization every 6 (six) hours as needed for wheezing or shortness of breath. 08/02/16   Tereasa Coop, PA-C  celecoxib (CELEBREX) 100 MG capsule Take 1 capsule (100 mg total) by mouth 2 (two) times daily. 11/12/16   Jaynee Eagles, PA-C  cetirizine (ZYRTEC) 10 MG tablet Take 1 tablet (10 mg total) by mouth daily. Do not miss doses. Patient not taking: Reported on 12/30/2016 08/02/16   Tereasa Coop, PA-C  chlorpheniramine-HYDROcodone [facility]) 10-8 MG/5ML SUER Take 5 mLs at bedtime as needed by mouth. 12/30/16   Jaynee Eagles, PA-C  cyclobenzaprine (FLEXERIL) 10 MG tablet Take 1 tablet by mouth daily as needed. 08/22/16   [provider]  predniSONE (DELTASONE) 20 MG tablet Take 2 tablets daily with breakfast. 12/30/16   Jaynee Eagles, PA-C    Allergies  Allergen Reactions  . Aspirin Anaphylaxis    Patient Active Problem List   Diagnosis Date Noted  . Abdominal pain in female 11/30/2015  . Pelvic pain in female 09/15/2013  . Dysmenorrhea 09/15/2013    Past Medical History:  Diagnosis Date  . Abnormal Pap smear   . Allergy   . Ankle fracture, right   . Anxiety   . Asthma   . Depression   . Headache(784.0)   . Heart palpitations    cardiac workup ok  . Hypoglycemia   . Ovarian cyst   . Tailbone injury    fracture    Past Surgical History:  Procedure Laterality Date  . ABSCESS DRAINAGE Left    leg  .  BREAST LUMPECTOMY Left   . CESAREAN SECTION     x 2  . LAPAROSCOPIC OVARIAN CYSTECTOMY     x 3  . TUBAL LIGATION      Social History   Socioeconomic History  . Marital status: Married    Spouse name: Not on file  . Number of children: 2  . Years of education: Not on file  . Highest education level: Not on file  Social Needs  . Financial resource strain: Not on file  . Food insecurity - worry: Not on file  . Food insecurity - inability: Not on file  . Transportation needs - medical: Not on file  . Transportation needs - non-medical: Not on file  Occupational History  . Not on file  Tobacco Use  . Smoking status: Never Smoker  . Smokeless tobacco: Never Used  Substance and Sexual Activity  . Alcohol use: No  . Drug use: No  . Sexual activity: Yes    Birth control/protection: Surgical  Other Topics Concern  . Not on file  Social History Narrative  . Not on file    Family History  Problem Relation Age of Onset  . Diabetes Father   . Kidney cancer Father   . Prostate cancer Maternal Uncle        x 2  . Kidney disease Maternal Uncle   .  Cancer Maternal Uncle        type unknown  . Diabetes Maternal Uncle   . Diabetes Paternal Aunt   . Diabetes Paternal Uncle   . Diabetes Maternal Aunt      Review of Systems  Constitutional: Negative.  Negative for chills, fever and malaise/fatigue.  Respiratory: Negative for shortness of breath.   Cardiovascular: Negative for chest pain and palpitations.  Gastrointestinal: Positive for abdominal pain and nausea. Negative for diarrhea, heartburn and vomiting.  Genitourinary: Negative for dysuria and hematuria.  Skin: Negative for rash.  Neurological: Negative for dizziness and headaches.  All other systems reviewed and are negative.  Vitals:   01/09/17 1534  BP: 132/80  Pulse: 94  Resp: 17  Temp: 98.4 F (36.9 C)  SpO2: 98%     Physical Exam  Constitutional: She is oriented to person, place, and time. She appears  well-developed and well-nourished. She appears ill. She appears distressed.  HENT:  Head: Normocephalic and atraumatic.  Mouth/Throat: Oropharynx is clear and moist.  Eyes: EOM are normal. Pupils are equal, round, and reactive to light.  Neck: Normal range of motion. Neck supple.  Cardiovascular: Normal rate and regular rhythm.  Pulmonary/Chest: Effort normal and breath sounds normal.  Abdominal: Soft. She exhibits distension. She exhibits no mass. There is tenderness. There is no guarding.  Musculoskeletal: Normal range of motion.  Neurological: She is alert and oriented to person, place, and time.  Skin: Skin is warm and dry. Capillary refill takes less than 2 seconds.  Psychiatric: She has a normal mood and affect. Her behavior is normal.  Vitals reviewed.  Pt in a lot of distress; abdominal pain increasingly getting worse with new symptoms since this am. DDx extensive. Needs diagnostic work up and symptomatic treatment. Advised to go to ER for further evaluation and treatment.  ASSESSMENT & PLAN: Airyanna was seen today for abdominal pain.  Diagnoses and all orders for this visit:  Acute abdominal pain    Patient Instructions    Go to ED now for further evaluation and work up.   Abdominal Pain, Adult Many things can cause belly (abdominal) pain. Most times, belly pain is not dangerous. Many cases of belly pain can be watched and treated at home. Sometimes belly pain is serious, though. Your doctor will try to find the cause of your belly pain. Follow these instructions at home:  Take over-the-counter and prescription medicines only as told by your doctor. Do not take medicines that help you poop (laxatives) unless told to by your doctor.  Drink enough fluid to keep your pee (urine) clear or pale yellow.  Watch your belly pain for any changes.  Keep all follow-up visits as told by your doctor. This is important. Contact a doctor if:  Your belly pain changes or gets  worse.  You are not hungry, or you lose weight without trying.  You are having trouble pooping (constipated) or have watery poop (diarrhea) for more than 2-3 days.  You have pain when you pee or poop.  Your belly pain wakes you up at night.  Your pain gets worse with meals, after eating, or with certain foods.  You are throwing up and cannot keep anything down.  You have a fever. Get help right away if:  Your pain does not go away as soon as your doctor says it should.  You cannot stop throwing up.  Your pain is only in areas of your belly, such as the right side or the  left lower part of the belly.  You have bloody or black poop, or poop that looks like tar.  You have very bad pain, cramping, or bloating in your belly.  You have signs of not having enough fluid or water in your body (dehydration), such as: ? Dark pee, very little pee, or no pee. ? Cracked lips. ? Dry mouth. ? Sunken eyes. ? Sleepiness. ? Weakness. This information is not intended to replace advice given to you by your health care provider. Make sure you discuss any questions you have with your health care provider. Document Released: 07/30/2007 Document Revised: 08/31/2015 Document Reviewed: 07/25/2015 Elsevier Interactive Patient Education  2017 [employer].    IF you received an x-ray today, you will receive an invoice from [facility]. Please contact [facility] at [phone] with questions or concerns regarding your invoice.   IF you received labwork today, you will receive an invoice from [facility]. Please contact [facility] at [phone] with questions or concerns regarding your invoice.   Our billing staff will not be able to assist you with questions regarding bills from these companies.  You will be contacted with the lab results as soon as they are available. The fastest way to get your results is to activate your My Chart account. Instructions are located on the last page  of this paperwork. If you have not heard from [place] regarding the results in 2 weeks, please contact this office.          Agustina Caroli, MD [facility]

## 2017-01-09 NOTE — Patient Instructions (Addendum)
  Go to ED now for further evaluation and work up.   Abdominal Pain, Adult Many things can cause belly (abdominal) pain. Most times, belly pain is not dangerous. Many cases of belly pain can be watched and treated at home. Sometimes belly pain is serious, though. Your doctor will try to find the cause of your belly pain. Follow these instructions at home:  Take over-the-counter and prescription medicines only as told by your doctor. Do not take medicines that help you poop (laxatives) unless told to by your doctor.  Drink enough fluid to keep your pee (urine) clear or pale yellow.  Watch your belly pain for any changes.  Keep all follow-up visits as told by your doctor. This is important. Contact a doctor if:  Your belly pain changes or gets worse.  You are not hungry, or you lose weight without trying.  You are having trouble pooping (constipated) or have watery poop (diarrhea) for more than 2-3 days.  You have pain when you pee or poop.  Your belly pain wakes you up at night.  Your pain gets worse with meals, after eating, or with certain foods.  You are throwing up and cannot keep anything down.  You have a fever. Get help right away if:  Your pain does not go away as soon as your doctor says it should.  You cannot stop throwing up.  Your pain is only in areas of your belly, such as the right side or the left lower part of the belly.  You have bloody or black poop, or poop that looks like tar.  You have very bad pain, cramping, or bloating in your belly.  You have signs of not having enough fluid or water in your body (dehydration), such as: ? Dark pee, very little pee, or no pee. ? Cracked lips. ? Dry mouth. ? Sunken eyes. ? Sleepiness. ? Weakness. This information is not intended to replace advice given to you by your health care provider. Make sure you discuss any questions you have with your health care provider. Document Released: 07/30/2007 Document  Revised: 08/31/2015 Document Reviewed: 07/25/2015 Elsevier Interactive Patient Education  2017 [employer].    IF you received an x-ray today, you will receive an invoice from [facility] Rad[facility]. Please contact [facility] Radiology at [phone] with questions or concerns regarding your invoice.   IF you received labwork today, you will receive an invoice from [facility]. Please contact [facility] at [phone] with questions or concerns regarding your invoice.   Our billing staff will not be able to assist you with questions regarding bills from these companies.  You will be contacted with the lab results as soon as they are available. The fastest way to get your results is to activate your My Chart account. Instructions are located on the last page of this paperwork. If you have not heard from [place] regarding the results in 2 weeks, please contact this office.

## 2017-02-23 IMAGING — CR DG CHEST 2V
2 series · 2 of 2 positions shown · non-contrast
Comparison: None.

CLINICAL DATA: 34-year-old presenting with acute onset of shortness
of breath, productive cough, rhinorrhea and myalgias which began 3
days ago.

EXAM:
CHEST  2 VIEW

[PA]
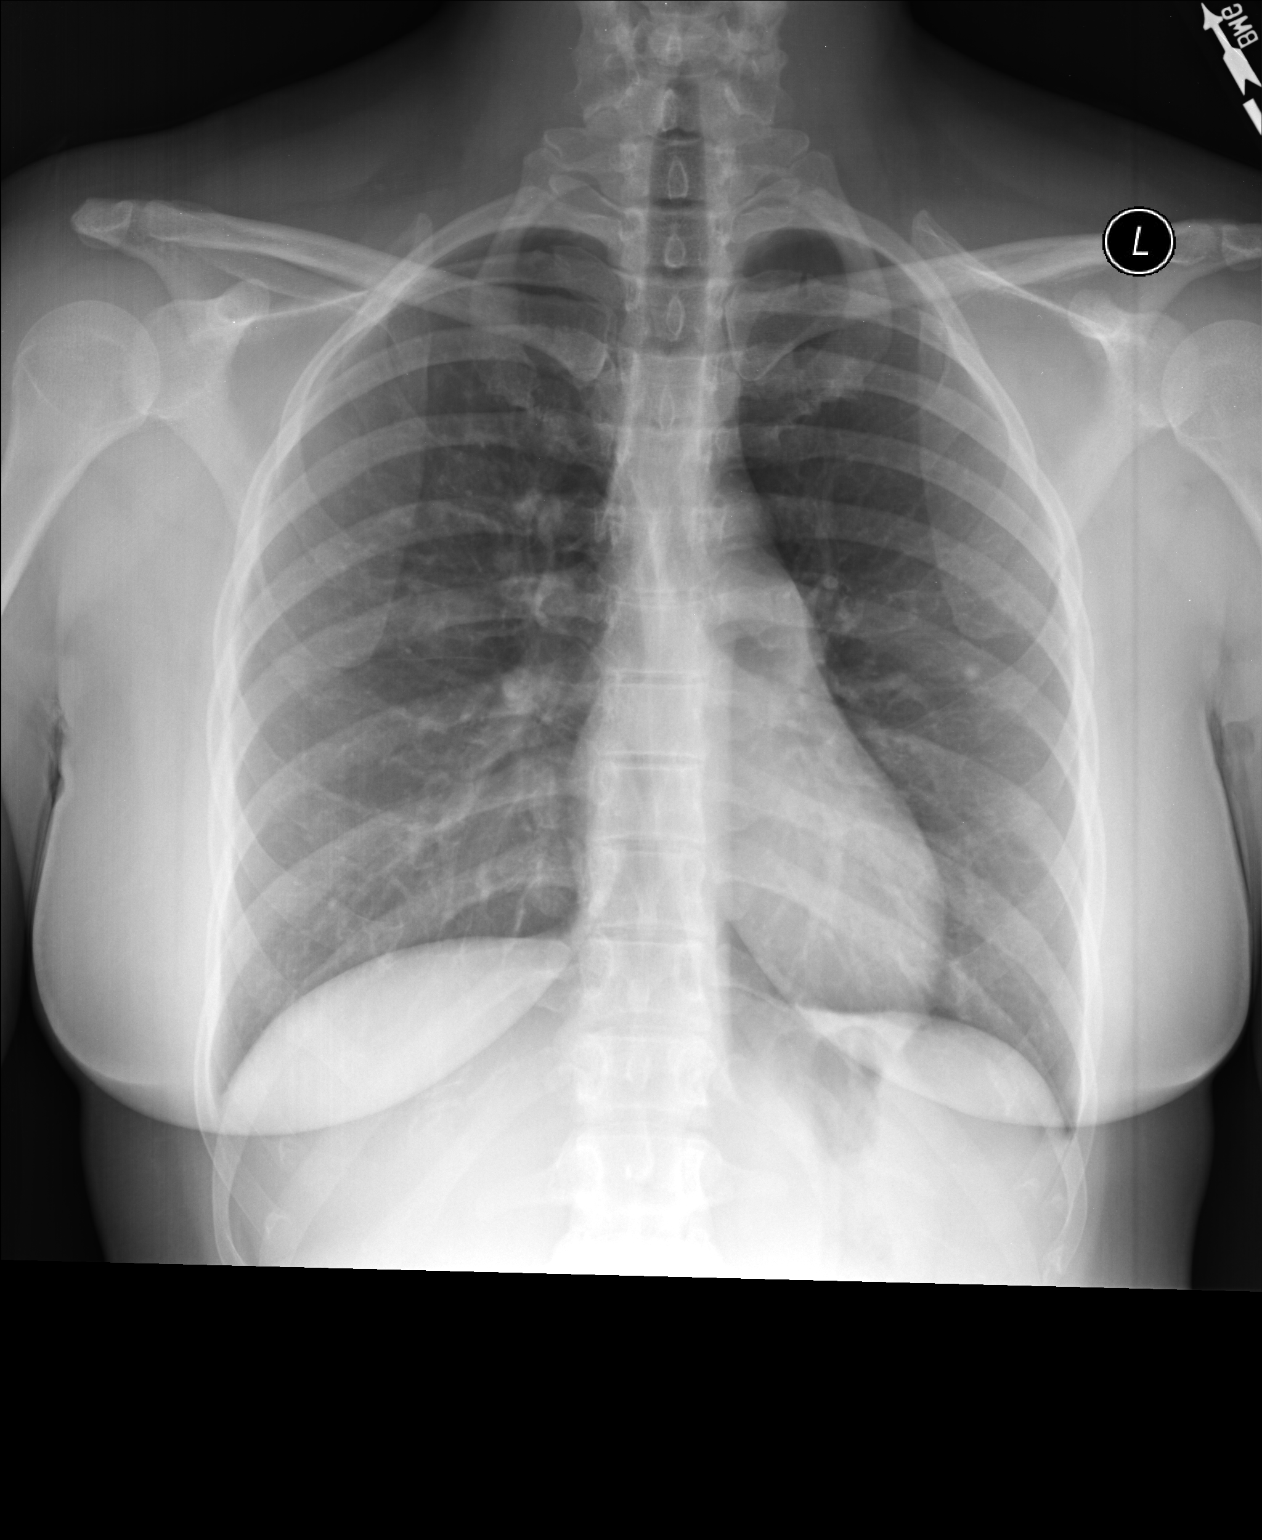

[lateral]
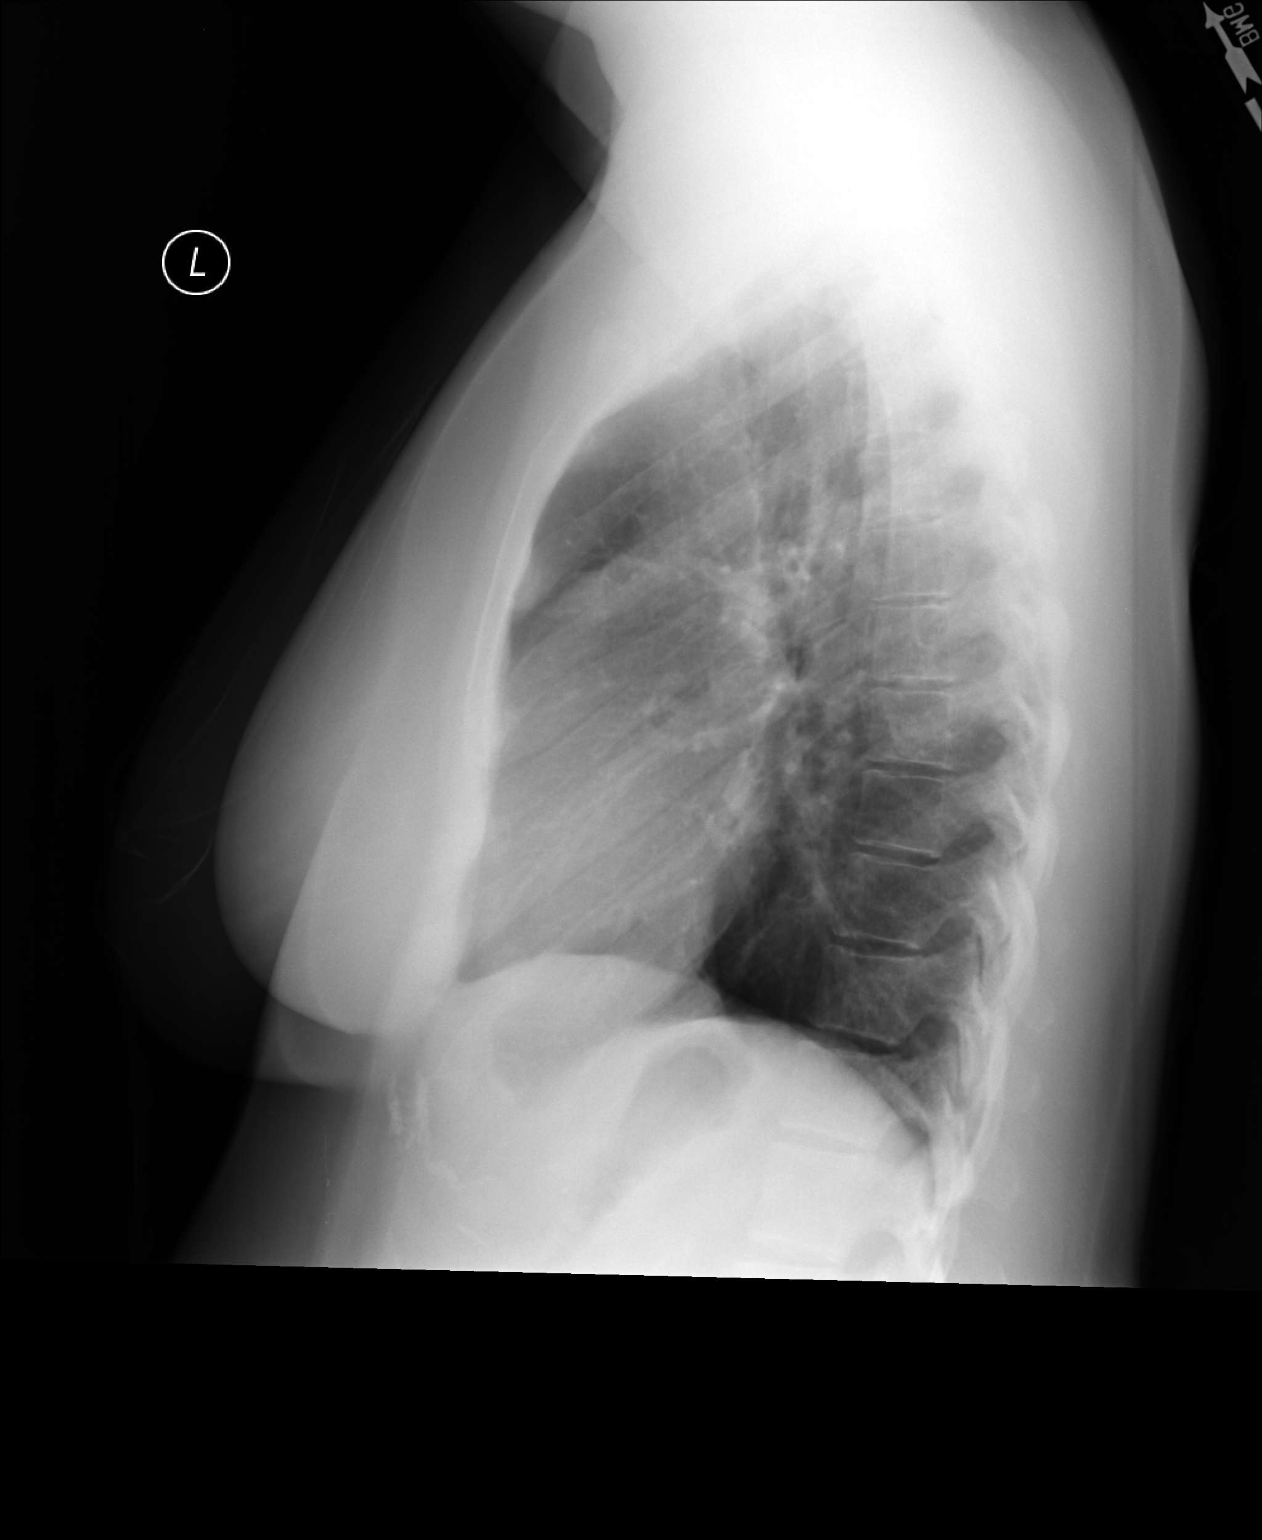

[2 of 2 positions shown; findings below may reference images not displayed]

FINDINGS: Cardiomediastinal silhouette unremarkable. Subcentimeter dense
nodule in the superior segment left lower lobe. Lungs otherwise
clear. Bronchovascular markings normal. No localized airspace
consolidation. No pleural effusions. No pneumothorax. Normal
pulmonary vascularity. Visualized bony thorax intact.
IMPRESSION: 1.  No acute cardiopulmonary disease.
2. Likely benign subcentimeter calcified granuloma in the superior
segment left lower lobe. In the absence of prior chest x-rays for
comparison, a follow-up chest x-ray in 3 months may be helpful to
confirm stability.

## 2017-05-04 ENCOUNTER — Encounter: Payer: Self-pay | Admitting: Gastroenterology

## 2017-05-04 ENCOUNTER — Ambulatory Visit (INDEPENDENT_AMBULATORY_CARE_PROVIDER_SITE_OTHER): Payer: 59 | Admitting: Gastroenterology

## 2017-05-04 VITALS — BP 128/70 | HR 84 | Ht <= 58 in | Wt 142.0 lb

## 2017-05-04 DIAGNOSIS — R194 Change in bowel habit: Secondary | ICD-10-CM | POA: Diagnosis not present

## 2017-05-04 DIAGNOSIS — R1011 Right upper quadrant pain: Secondary | ICD-10-CM | POA: Diagnosis not present

## 2017-05-04 MED ORDER — NA SULFATE-K SULFATE-MG SULF 17.5-3.13-1.6 GM/177ML PO SOLN: 1.0000 | Freq: Once | ORAL | 0 refills | Status: AC

## 2017-05-04 NOTE — Progress Notes (Signed)
[facility] Consult Note:  History: Sydney Sullivan 05/04/2017  Referring physician: Jaynee Eagles, PA-C  Reason for consult/chief complaint: Abdominal Pain (pt reports right mid abdominal cramping pain that radiates around to her back x 2 months; ); Diarrhea (pt has had intermittent diarrhea that started with the abdominal pain); and early satiety (pt reports being unable to eat much - she feels full very quickly)   Subjective  HPI:  This is a 37 year old woman referred by primary care for abdominal pain.  In November 2018 she was seen by primary care for acute onset abdominal pain that began after eating a [employer] ,for which she was advised to go to the ED. Symptoms improved after a large loose BM that day, so she did not go.  I saw her in December 2017 for about a year of crampy lower abdominal pain with alternating constipation and diarrhea.  It sounded most like IBS, and there was an element of lactose intolerance with significant reported symptom improvement after eliminating lactose.  Episodes of pain were brief, no medicines were prescribed.  Some dietary advice was given.  Hemoglobin/hematocrit and TSH ordered at that time were normal.  She has not been back to see me since then.  She  still has a constellation of symptoms with right upper quadrant pain that sometimes radiates around to the back.  This has been there fairly constantly, not necessarily worse with meals or bowel movement.  She still alternates between diarrhea and constipation, with improvement in bloating and lower abdominal pain after BMs.  She has early satiety, no nausea vomiting or dysphagia. Other than some scant blood on the toilet paper at times, no frank rectal bleeding. ROS:  Review of Systems  Remainder systems negative except as above past Medical History: Past Medical History:  Diagnosis Date  . Abnormal Pap smear   . Allergy   . Ankle fracture, right   . Anxiety     . Asthma   . Depression   . Headache(784.0)   . Heart palpitations    cardiac workup ok  . Hypoglycemia   . Ovarian cyst   . Tailbone injury    fracture     Past Surgical History: Past Surgical History:  Procedure Laterality Date  . ABSCESS DRAINAGE Left    leg  . BREAST LUMPECTOMY Left   . CESAREAN SECTION     x 2  . LAPAROSCOPIC OVARIAN CYSTECTOMY     x 3  . TUBAL LIGATION       Family History: Family History  Problem Relation Age of Onset  . Cholecystitis Mother   . Diabetes Father   . Kidney cancer Father   . Prostate cancer Maternal Uncle        x 2  . Kidney disease Maternal Uncle   . Cancer Maternal Uncle        type unknown  . Diabetes Maternal Uncle   . Diabetes Paternal Aunt   . Diabetes Paternal Uncle   . Diabetes Maternal Aunt   . Cholecystitis Maternal Aunt     Social History: Social History   Socioeconomic History  . Marital status: Married    Spouse name: None  . Number of children: 2  . Years of education: None  . Highest education level: None  Social Needs  . Financial resource strain: None  . Food insecurity - worry: None  . Food insecurity - inability: None  . Transportation needs - medical: None  . Transportation needs -  non-medical: None  Occupational History  . None  Tobacco Use  . Smoking status: Never Smoker  . Smokeless tobacco: Never Used  Substance and Sexual Activity  . Alcohol use: No  . Drug use: No  . Sexual activity: Yes    Birth control/protection: Surgical  Other Topics Concern  . None  Social History Narrative  . None    Allergies: Allergies  Allergen Reactions  . Aspirin Anaphylaxis    Outpatient Meds: Current Outpatient Medications  Medication Sig Dispense Refill  . albuterol (PROVENTIL) (2.5 MG/3ML) 0.083% nebulizer solution Take 3 mLs (2.5 mg total) by nebulization every 6 (six) hours as needed for wheezing or shortness of breath. 150 mL 1  . celecoxib (CELEBREX) 100 MG capsule Take 1 capsule  (100 mg total) by mouth 2 (two) times daily. 30 capsule 1  . cetirizine (ZYRTEC) 10 MG tablet Take 1 tablet (10 mg total) by mouth daily. Do not miss doses. 30 tablet 11  . Na Sulfate-K Sulfate-Mg Sulf 17.5-3.13-1.6 GM/177ML SOLN Take 1 kit by mouth once for 1 dose. 354 mL 0   No current facility-administered medications for this visit.       ___________________________________________________________________ Objective   Exam:  BP 128/70   Pulse 84   Ht _0  (1.473 m)   Wt 142 lb (64.4 kg)   BMI 29.68 kg/m    General: this is a(n) well-appearing woman accompanied by her husband, who translates Spanish for her.  Eyes: sclera anicteric, no redness  ENT: oral mucosa moist without lesions, no cervical or supraclavicular lymphadenopathy, good dentition  CV: RRR without murmur, S1/S2, no JVD, no peripheral edema  Resp: clear to auscultation bilaterally, normal RR and effort noted  GI: soft, no tenderness, with active bowel sounds. No guarding or palpable organomegaly noted.  Skin; warm and dry, no rash or jaundice noted  Neuro: awake, alert and oriented x 3. Normal gross motor function and fluent speech  Labs:  CBC Latest Ref Rng & Units 08/02/2016 01/30/2016 11/13/2015  WBC 4.6 - 10.2 K/uL 7.4 - 7.0  Hemoglobin 12.2 - 16.2 g/dL 12.5 12.5 12.1  Hematocrit 37.7 - 47.9 % 36.9(A) 38.0 39.9  Platelets 150 - 400 K/uL - - 263   No recent labs or imaging.  Assessment: Encounter Diagnoses  Name Primary?  . RUQ pain Yes  . 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  There may be some dietary intolerances as well.  I gave them written dietary advice.  Less likely concerns are celiac sprue and inflammatory bowel disease, and even less likely neoplasia.  It does not sound typical of biliary colic with fairly constant pain and the associated altered bowel  habits.  Plan:  EGD colonoscopy  The benefits and risks of the planned procedure were described in detail with the patient or (when appropriate) their health care proxy.  Risks were outlined as including, but not limited to, bleeding, infection, perforation, adverse medication reaction leading to cardiac or pulmonary decompensation, or pancreatitis (if ERCP).  The limitation of incomplete mucosal visualization was also discussed.  No guarantees or warranties were given.  I decided not to start any medicines until after her procedures.  Thank you for the courtesy of this consult.  Please call me with any questions or concerns.  Nelida Meuse III  CC: Jaynee Eagles, PA-C

## 2017-05-04 NOTE — Patient Instructions (Addendum)
If you are age 38 or older, your body mass index should be between 23-30. Your Body mass index is 29.68 kg/m. If this is out of the aforementioned range listed, please consider follow up with your Primary Care Provider.  If you are age 25 or younger, your body mass index should be between 19-25. Your Body mass index is 29.68 kg/m. If this is out of the aformentioned range listed, please consider follow up with your Primary Care Provider.   You have been scheduled for an endoscopy and colonoscopy. Please follow the written instructions given to you at your visit today. Please pick up your prep supplies at the pharmacy within the next 1-3 days. If you use inhalers (even only as needed), please bring them with you on the day of your procedure. Your physician has requested that you go to www.startemmi.com and enter the access code given to you at your visit today. This web site gives a general overview about your procedure. However, you should still follow specific instructions given to you by our office regarding your preparation for the procedure.  Food Guidelines for a sensitive stomach  Many people have difficulty digesting certain foods, causing a variety of distressing and embarrassing symptoms such as abdominal pain, bloating and gas.  These foods may need to be avoided or consumed in small amounts.  Here are some tips that might be helpful for you.  1.   Lactose intolerance is the difficulty or complete inability to digest lactose, the natural sugar in milk and anything made from milk.  This condition is harmless, common, and can begin any time during life.  Some people can digest a modest amount of lactose while others cannot tolerate any.  Also, not all dairy products contain equal amounts of lactose.  For example, hard cheeses such as parmesan have less lactose than soft cheeses such as cheddar.  Yogurt has less lactose than milk or cheese.  Many packaged foods (even many brands of bread) have  milk, so read ingredient lists carefully.  It is difficult to test for lactose intolerance, so just try avoiding lactose as much as possible for a week and see what happens with your symptoms.  If you seem to be lactose intolerant, the best plan is to avoid it (but make sure you get calcium from another source).  The next best thing is to use lactase enzyme supplements, available over the counter everywhere.  Just know that many lactose intolerant people need to take several tablets with each serving of dairy to avoid symptoms.  Lastly, a lot of restaurant food is made with milk or butter.  Many are things you might not suspect, such as mashed potatoes, rice and pasta (cooked with butter) and "grilled" items.  If you are lactose intolerant, it never hurts to ask your server what has milk or butter.  2.   Fiber is an important part of your diet, but not all fiber is well-tolerated.  Insoluble fiber such as bran is often consumed by normal gut bacteria and converted into gas.  Soluble fiber such as oats, squash, carrots and green beans are typically tolerated better.  3.   Some types of carbohydrates can be poorly digested.  Examples include: fructose (apples, cherries, pears, raisins and other dried fruits), fructans (onions, zucchini, large amounts of wheat), sorbitol/mannitol/xylitol and sucralose/Splenda (common artificial sweeteners), and raffinose (lentils, broccoli, cabbage, asparagus, brussel sprouts, many types of beans).  Do a Development worker, community for [employer] and you will find  helpful information. Beano, a dietary supplement, will often help with raffinose-containing foods.  As with lactase tablets, you may need several per serving.  4.   Whenever possible, avoid processed food&meats and chemical additives.  High fructose corn syrup, a common sweetener, may be difficult to digest.  Eggs and soy (comes from the soybean, and added to many foods now) are other common bloating/gassy foods.  - Dr. Herma [facility]

## 2017-05-26 ENCOUNTER — Encounter: Payer: Self-pay | Admitting: Gastroenterology

## 2017-05-26 ENCOUNTER — Ambulatory Visit (AMBULATORY_SURGERY_CENTER): Payer: 59 | Admitting: Gastroenterology

## 2017-05-26 VITALS — BP 108/61 | HR 86 | Temp 98.4°F | Resp 15 | Ht <= 58 in | Wt 142.0 lb

## 2017-05-26 DIAGNOSIS — D122 Benign neoplasm of ascending colon: Secondary | ICD-10-CM | POA: Diagnosis not present

## 2017-05-26 DIAGNOSIS — K621 Rectal polyp: Secondary | ICD-10-CM

## 2017-05-26 DIAGNOSIS — D129 Benign neoplasm of anus and anal canal: Secondary | ICD-10-CM

## 2017-05-26 DIAGNOSIS — R194 Change in bowel habit: Secondary | ICD-10-CM

## 2017-05-26 DIAGNOSIS — D128 Benign neoplasm of rectum: Secondary | ICD-10-CM

## 2017-05-26 DIAGNOSIS — R1011 Right upper quadrant pain: Secondary | ICD-10-CM

## 2017-05-26 MED ORDER — SODIUM CHLORIDE 0.9 % IV SOLN: 500.0000 mL | Freq: Once | INTRAVENOUS | Status: AC

## 2017-05-26 NOTE — Progress Notes (Signed)
Called to room to assist during endoscopic procedure.  Patient ID and intended procedure confirmed with present staff. Received instructions for my participation in the procedure from the performing physician.  

## 2017-05-26 NOTE — Progress Notes (Signed)
Pt's states no medical or surgical changes since previsit or office visit. 

## 2017-05-26 NOTE — Progress Notes (Signed)
No problems noted in the recovery room. Maw  Pt was given information on diverticulosis, polyps and irritable bowel syndrome in english and spanish.  Pt's husbands speaks and reads both languages per husband.  maw

## 2017-05-26 NOTE — Op Note (Signed)
[place] Patient Name: Sydney Sullivan Procedure Date: 05/26/2017 2:59 PM MRN: [id number] Endoscopist: [name] , MD Age: 37 Referring MD:  Date of Birth: 06-Jul-1980 Gender: Female Account #: [id number] Procedure:                Upper GI endoscopy Indications:              Abdominal pain in the right upper quadrant Medicines:                Monitored Anesthesia Care Procedure:                Pre-Anesthesia Assessment:                           - Prior to the procedure, a History and Physical                            was performed, and patient medications and                            allergies were reviewed. The patient's tolerance of                            previous anesthesia was also reviewed. The risks                            and benefits of the procedure and the sedation                            options and risks were discussed with the patient.                            All questions were answered, and informed consent                            was obtained. Prior Anticoagulants: The patient has                            taken no previous anticoagulant or antiplatelet                            agents. ASA Grade Assessment: II - A patient with                            mild systemic disease. After reviewing the risks                            and benefits, the patient was deemed in                            satisfactory condition to undergo the procedure.                           After obtaining informed consent, the endoscope was  passed under direct vision. Throughout the                            procedure, the patient's blood pressure, pulse, and                            oxygen saturations were monitored continuously. The                            Endoscope was introduced through the mouth, and                            advanced to the second part of duodenum. The upper                            GI  endoscopy was accomplished without difficulty.                            The patient tolerated the procedure well. Scope In: Scope Out: Findings:                 The larynx was normal.                           The esophagus was normal.                           The stomach was normal.                           The cardia and gastric fundus were normal on                            retroflexion.                           The examined duodenum was normal. Complications:            No immediate complications. Estimated Blood Loss:     Estimated blood loss: none. Impression:               - Normal larynx.                           - Normal esophagus.                           - Normal stomach.                           - Normal examined duodenum.                           - No specimens collected. Recommendation:           - Patient has a contact number available for                            emergencies. The signs and  symptoms of potential                            delayed complications were discussed with the                            patient. Return to normal activities tomorrow.                            Written discharge instructions were provided to the                            patient.                           - Resume previous diet.                           - Continue present medications.                           - See the other procedure note for documentation of                            additional recommendations.                           - RUQ ultrasound will be scheduled Henry L. Loletha Carrow, MD 05/26/2017 3:26:26 PM This report has been signed electronically.

## 2017-05-26 NOTE — Patient Instructions (Addendum)
YOU HAD AN ENDOSCOPIC PROCEDURE TODAY AT [facility]:   Refer to the procedure report that was given to you for any specific questions about what was found during the examination.  If the procedure report does not answer your questions, please call your gastroenterologist to clarify.  If you requested that your care partner not be given the details of your procedure findings, then the procedure report has been included in a sealed envelope for you to review at your convenience later.  YOU SHOULD EXPECT: Some feelings of bloating in the abdomen. Passage of more gas than usual.  Walking can help get rid of the air that was put into your GI tract during the procedure and reduce the bloating. If you had a lower endoscopy (such as a colonoscopy or flexible sigmoidoscopy) you may notice spotting of blood in your stool or on the toilet paper. If you underwent a bowel prep for your procedure, you may not have a normal bowel movement for a few days.  Please Note:  You might notice some irritation and congestion in your nose or some drainage.  This is from the oxygen used during your procedure.  There is no need for concern and it should clear up in a day or so.  SYMPTOMS TO REPORT IMMEDIATELY:   Following lower endoscopy (colonoscopy or flexible sigmoidoscopy):  Excessive amounts of blood in the stool  Significant tenderness or worsening of abdominal pains  Swelling of the abdomen that is new, acute  Fever of 100F or higher   Following upper endoscopy (EGD)  Vomiting of blood or coffee ground material  New chest pain or pain under the shoulder blades  Painful or persistently difficult swallowing  New shortness of breath  Fever of 100F or higher  Black, tarry-looking stools  For urgent or emergent issues, a gastroenterologist can be reached at any hour by calling [phone].   DIET:  We do recommend a small meal at first, but then you may proceed to your regular diet.  Drink  plenty of fluids but you should avoid alcoholic beverages for 24 hours.  ACTIVITY:  You should plan to take it easy for the rest of today and you should NOT DRIVE or use heavy machinery until tomorrow (because of the sedation medicines used during the test).    FOLLOW UP: Our staff will call the number listed on your records the next business day following your procedure to check on you and address any questions or concerns that you may have regarding the information given to you following your procedure. If we do not reach you, we will leave a message.  However, if you are feeling well and you are not experiencing any problems, there is no need to return our call.  We will assume that you have returned to your regular daily activities without incident.  If any biopsies were taken you will be contacted by phone or by letter within the next 1-3 weeks.  Please call us at [phone] if you have not heard about the biopsies in 3 weeks.    SIGNATURES/CONFIDENTIALITY: You and/or your care partner have signed paperwork which will be entered into your electronic medical record.  These signatures attest to the fact that that the information above on your After Visit Summary has been reviewed and is understood.  Full responsibility of the confidentiality of this discharge information lies with you and/or your care-partner.    Handouts were given to your care partner on polyps,  diverticulosis, and Irritable bowel  Syndrome. Right upper Quadrant abdominal ultrasound to be scheduled.  Dr. Corena Pilgrim nurse in the office will call you with this appointment and instructions. You may resume your current medications today. Await biopsy results. Please call if any questions or concerns.

## 2017-05-26 NOTE — Op Note (Signed)
[place] Patient Name: Sydney Sullivan Procedure Date: 05/26/2017 2:58 PM MRN: [id number] Endoscopist: Spickard. Loletha Carrow , MD Age: 37 Referring MD:  Date of Birth: 05-25-1980 Gender: Female Account #: [id number] Procedure:                Colonoscopy Indications:              Abdominal pain in the right upper quadrant, Change                            in bowel habits (alternating constipation and                            diarrhea) Medicines:                Monitored Anesthesia Care Procedure:                Pre-Anesthesia Assessment:                           - Prior to the procedure, a History and Physical                            was performed, and patient medications and                            allergies were reviewed. The patient's tolerance of                            previous anesthesia was also reviewed. The risks                            and benefits of the procedure and the sedation                            options and risks were discussed with the patient.                            All questions were answered, and informed consent                            was obtained. Prior Anticoagulants: The patient has                            taken no previous anticoagulant or antiplatelet                            agents. ASA Grade Assessment: II - A patient with                            mild systemic disease. After reviewing the risks                            and benefits, the patient was deemed in  satisfactory condition to undergo the procedure.                           After obtaining informed consent, the colonoscope                            was passed under direct vision. Throughout the                            procedure, the patient's blood pressure, pulse, and                            oxygen saturations were monitored continuously. The                            Model PCF-H190DL [id number]) scope was  introduced                            through the anus and advanced to the the terminal                            ileum. The colonoscopy was performed without                            difficulty. The patient tolerated the procedure                            well. The quality of the bowel preparation was                            excellent. The terminal ileum, ileocecal valve,                            appendiceal orifice, and rectum were photographed.                            The quality of the bowel preparation was evaluated                            using the BBPS [facility] Bowel Preparation Scale)                            with scores of: Right Colon = 3, Transverse Colon =                            3 and Left Colon = 3 (entire mucosa seen well with                            no residual staining, small fragments of stool or                            opaque liquid). The total BBPS score equals 9. The  bowel preparation used was SUPREP. Scope In: 3:06:52 PM Scope Out: 3:19:55 PM Scope Withdrawal Time: 0 hours 11 minutes 32 seconds  Total Procedure Duration: 0 hours 13 minutes 3 seconds  Findings:                 The perianal and digital rectal examinations were                            normal.                           The terminal ileum appeared normal.                           Two sessile polyps were found in the rectum and                            ascending colon. The polyps were 2 to 6 mm in size.                            AC polyp with mucous cap. These polyps were removed                            with a cold snare. Resection and retrieval were                            complete.                           Multiple small-mouthed diverticula were found in                            the left colon.                           The exam was otherwise without abnormality on                            direct and retroflexion  views. Complications:            No immediate complications. Estimated Blood Loss:     Estimated blood loss was minimal. Impression:               - The examined portion of the ileum was normal.                           - Two 2 to 6 mm polyps in the rectum and in the                            ascending colon, removed with a cold snare.                            Resected and retrieved.                           - Diverticulosis in the left  colon.                           - The examination was otherwise normal on direct                            and retroflexion views.                           Overall, symptom complex is most consistent with                            IBS. See EGD report and recommendations. Recommendation:           - Patient has a contact number available for                            emergencies. The signs and symptoms of potential                            delayed complications were discussed with the                            patient. Return to normal activities tomorrow.                            Written discharge instructions were provided to the                            patient.                           - Resume previous diet.                           - Continue present medications.                           - Await pathology results.                           - Repeat colonoscopy is recommended for                            surveillance. The colonoscopy date will be                            determined after pathology results from today's                            exam become available for review. Mingo Siegert L. Loletha Carrow, MD 05/26/2017 3:30:41 PM This report has been signed electronically.

## 2017-05-26 NOTE — Progress Notes (Signed)
A and O x3. Report to RN. Tolerated MAC anesthesia well.Teeth unchanged after procedure.

## 2017-05-27 ENCOUNTER — Telehealth: Payer: Self-pay | Admitting: *Deleted

## 2017-05-27 ENCOUNTER — Telehealth: Payer: Self-pay | Admitting: Gastroenterology

## 2017-05-27 ENCOUNTER — Other Ambulatory Visit: Payer: Self-pay

## 2017-05-27 DIAGNOSIS — R1011 Right upper quadrant pain: Secondary | ICD-10-CM

## 2017-05-27 NOTE — Telephone Encounter (Signed)
  Follow up Call-  Call back number 05/26/2017  Post procedure Call Back phone  # [phone]  Permission to leave phone message Yes  Some recent data might be hidden    Spoke with husband Patient questions:  Do you have a fever, pain , or abdominal swelling? No. Pain Score  0 *  Have you tolerated food without any problems? Yes.    Have you been able to return to your normal activities? Yes.    Do you have any questions about your discharge instructions: Diet   No. Medications  No. Follow up visit  No.  Do you have questions or concerns about your Care? No.  Actions: * If pain score is 4 or above: No action needed, pain <4.

## 2017-05-27 NOTE — Progress Notes (Unsigned)
Scheduled [place] for April 10th 2019 @ 930am.

## 2017-05-27 NOTE — Telephone Encounter (Signed)
Please schedule RUQ abdominal ultrasound for this patient.  Dx: RUQ pain She is Spanish-speaking, but her husband speaks [place].

## 2017-05-27 NOTE — Telephone Encounter (Signed)
[place] scheduled for Wednesday April 10 @ 930am. Left a voicemail via [place] speaking). I also mailed a letter with appointment details.

## 2017-06-03 ENCOUNTER — Ambulatory Visit (HOSPITAL_COMMUNITY)
Admission: RE | Admit: 2017-06-03 | Discharge: 2017-06-03 | Disposition: A | Discharge status: Home / Self Care | Payer: 59 | Source: Ambulatory Visit | Attending: Gastroenterology | Admitting: Gastroenterology

## 2017-06-03 DIAGNOSIS — R1011 Right upper quadrant pain: Secondary | ICD-10-CM | POA: Diagnosis present

## 2017-06-07 ENCOUNTER — Encounter: Payer: Self-pay | Admitting: Gastroenterology

## 2017-06-08 ENCOUNTER — Other Ambulatory Visit: Payer: Self-pay

## 2017-06-08 MED ORDER — HYOSCYAMINE SULFATE 0.125 MG SL SUBL: 0.1250 mg | SUBLINGUAL_TABLET | Freq: Four times a day (QID) | SUBLINGUAL | 2 refills | Status: AC | PRN

## 2017-09-18 IMAGING — DX DG CHEST 2V
2 series · 2 of 2 positions shown · non-contrast
Comparison: 04/20/2015

CLINICAL DATA: Chest pain and shortness of breath.

EXAM:
CHEST  2 VIEW

[chest pa]
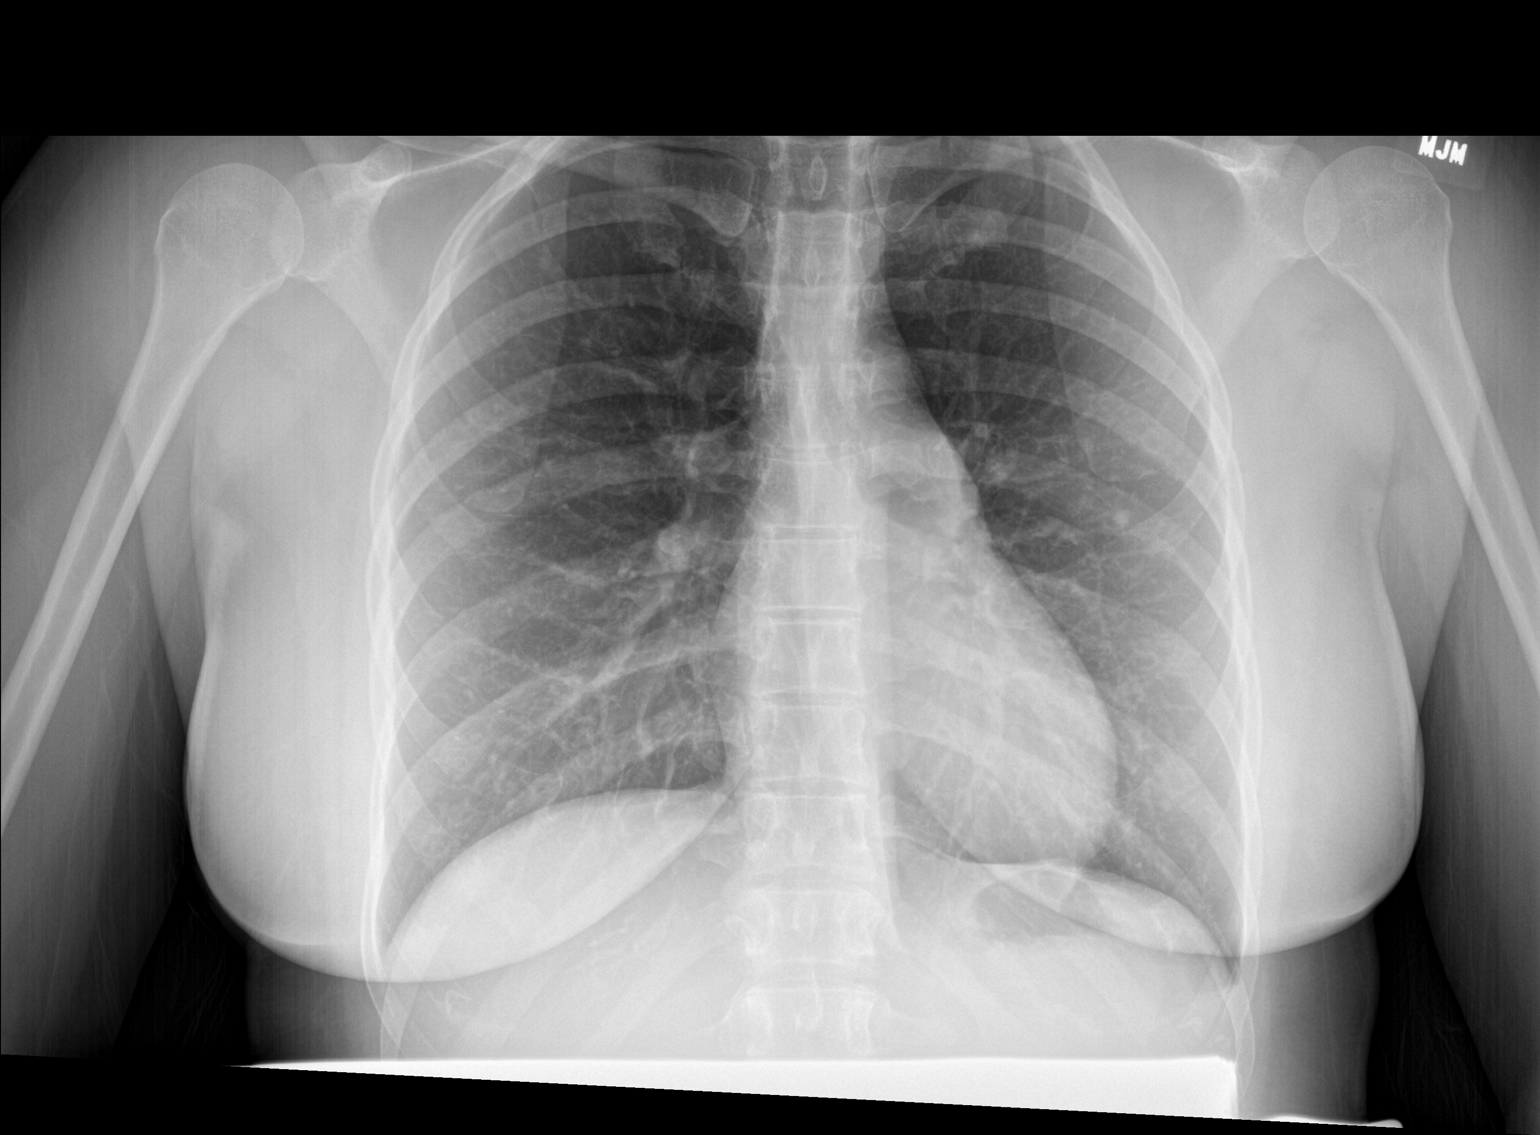

[chest lat]
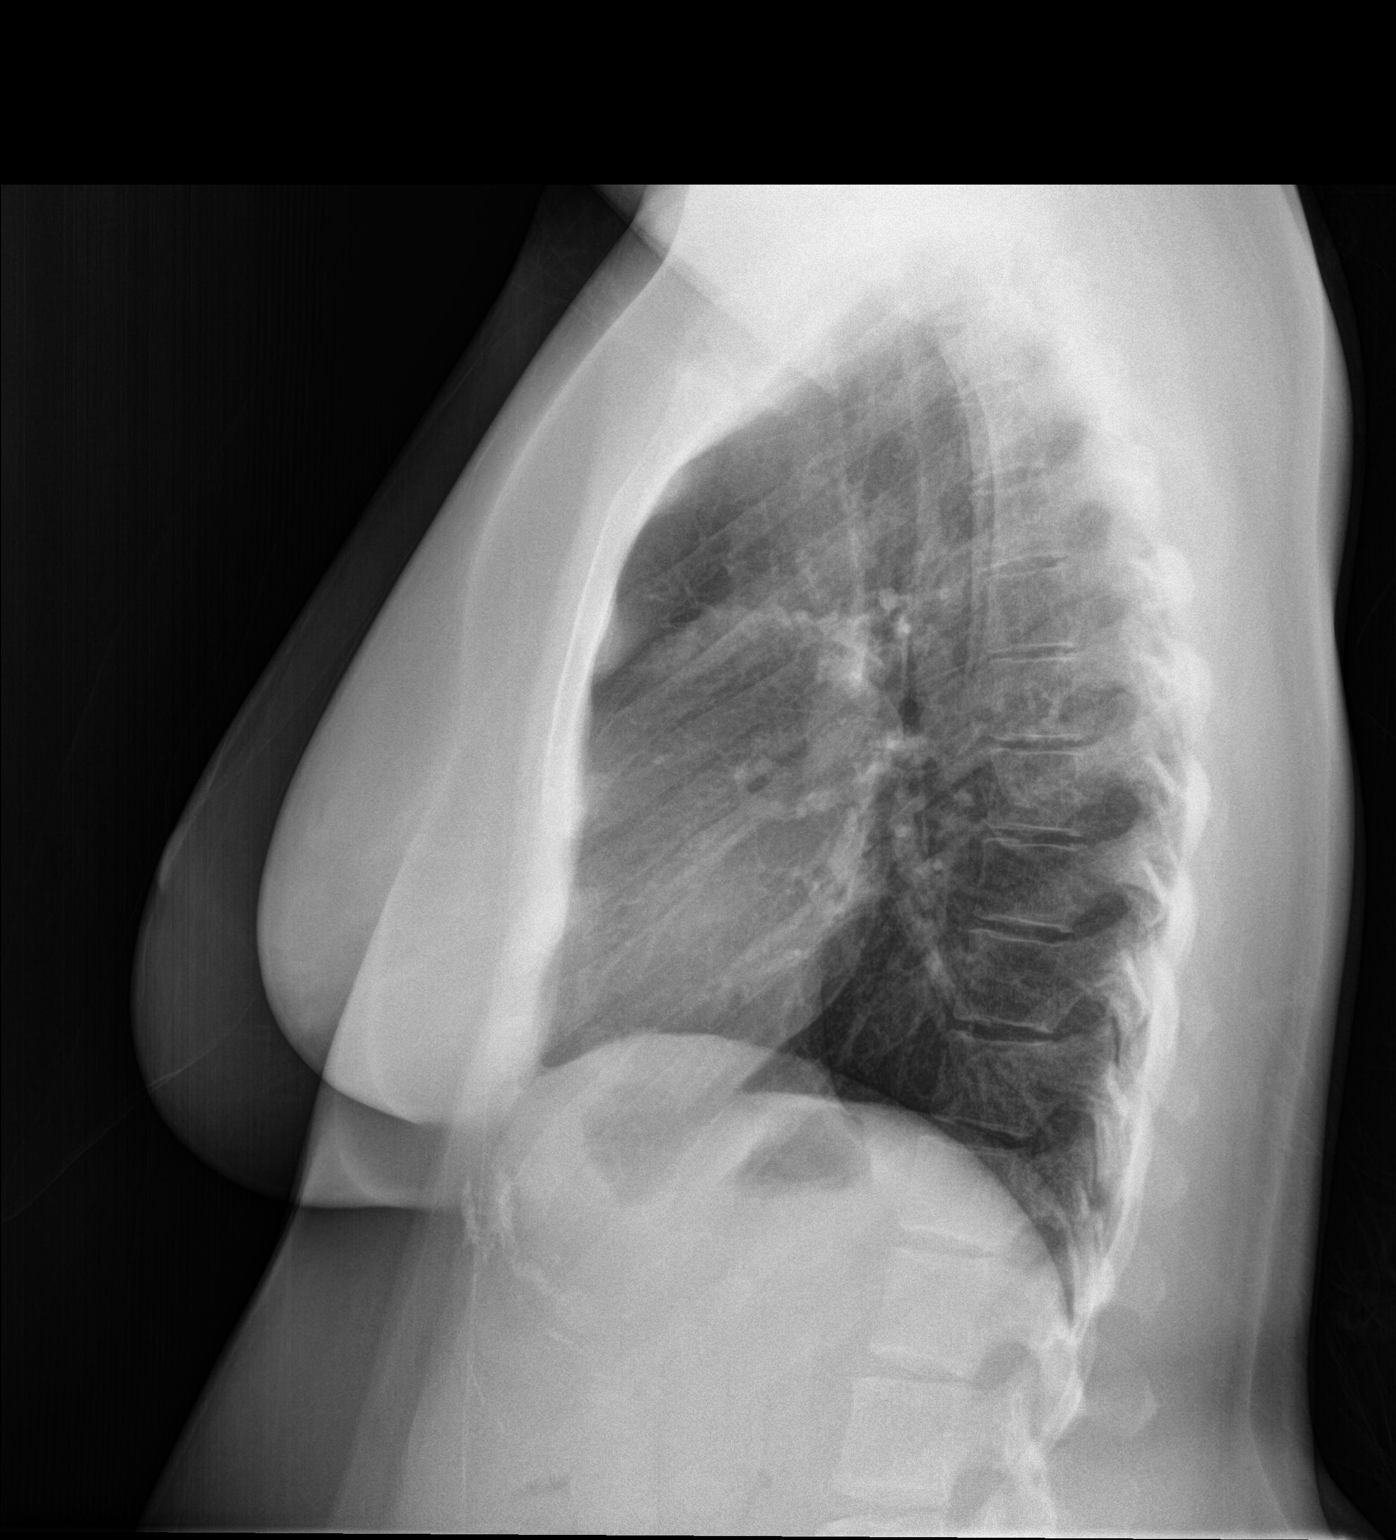

[2 of 2 positions shown; findings below may reference images not displayed]

FINDINGS: Dense 4 mm left mid lung nodule, no change from 07/25/2014, most
likely calcified granuloma.

Airway thickening is present, suggesting bronchitis or reactive
airways disease. Cardiac and mediastinal margins appear normal. No
pleural effusion.
IMPRESSION: 1. Airway thickening is present, suggesting bronchitis or reactive
airways disease.

## 2018-02-11 ENCOUNTER — Encounter: Payer: Self-pay | Admitting: Emergency Medicine

## 2018-02-11 ENCOUNTER — Other Ambulatory Visit: Payer: Self-pay

## 2018-02-11 ENCOUNTER — Ambulatory Visit (INDEPENDENT_AMBULATORY_CARE_PROVIDER_SITE_OTHER): Payer: 59 | Admitting: Emergency Medicine

## 2018-02-11 VITALS — BP 116/55 | HR 85 | Temp 98.4°F | Resp 16 | Ht <= 58 in | Wt 145.8 lb

## 2018-02-11 DIAGNOSIS — J22 Unspecified acute lower respiratory infection: Secondary | ICD-10-CM | POA: Diagnosis not present

## 2018-02-11 DIAGNOSIS — R0989 Other specified symptoms and signs involving the circulatory and respiratory systems: Secondary | ICD-10-CM | POA: Diagnosis not present

## 2018-02-11 DIAGNOSIS — Z8709 Personal history of other diseases of the respiratory system: Secondary | ICD-10-CM | POA: Insufficient documentation

## 2018-02-11 DIAGNOSIS — R05 Cough: Secondary | ICD-10-CM

## 2018-02-11 DIAGNOSIS — R059 Cough, unspecified: Secondary | ICD-10-CM | POA: Insufficient documentation

## 2018-02-11 MED ORDER — PROMETHAZINE-CODEINE 6.25-10 MG/5ML PO SYRP: 5.0000 mL | ORAL_SOLUTION | Freq: Every evening | ORAL | 0 refills | Status: AC | PRN

## 2018-02-11 MED ORDER — PSEUDOEPHEDRINE-GUAIFENESIN ER 60-600 MG PO TB12: 1.0000 | ORAL_TABLET | Freq: Two times a day (BID) | ORAL | 1 refills | Status: AC

## 2018-02-11 MED ORDER — AZITHROMYCIN 250 MG PO TABS: ORAL_TABLET | ORAL | 0 refills | Status: DC

## 2018-02-11 MED ORDER — PREDNISONE 20 MG PO TABS: 40.0000 mg | ORAL_TABLET | Freq: Every day | ORAL | 0 refills | Status: AC

## 2018-02-11 NOTE — Patient Instructions (Addendum)
     If you have lab work done today you will be contacted with your lab results within the next 2 weeks.  If you have not heard from us then please contact us. The fastest way to get your results is to register for My Chart.   IF you received an x-ray today, you will receive an invoice from [facility]. Please contact [facility] at [phone] with questions or concerns regarding your invoice.   IF you received labwork today, you will receive an invoice from [facility]. Please contact [facility] at [phone] with questions or concerns regarding your invoice.   Our billing staff will not be able to assist you with questions regarding bills from these companies.  You will be contacted with the lab results as soon as they are available. The fastest way to get your results is to activate your My Chart account. Instructions are located on the last page of this paperwork. If you have not heard from us regarding the results in 2 weeks, please contact this office.     Cough, Adult  A cough helps to clear your throat and lungs. A cough may last only 2-3 weeks (acute), or it may last longer than 8 weeks (chronic). Many different things can cause a cough. A cough may be a sign of an illness or another medical condition. Follow these instructions at home:  Pay attention to any changes in your cough.  Take medicines only as told by your doctor. ? If you were prescribed an antibiotic medicine, take it as told by your doctor. Do not stop taking it even if you start to feel better. ? Talk with your doctor before you try using a cough medicine.  Drink enough fluid to keep your pee (urine) clear or pale yellow.  If the air is dry, use a cold steam vaporizer or humidifier in your home.  Stay away from things that make you cough at work or at home.  If your cough is worse at night, try using extra pillows to raise your head up higher while you sleep.  Do not smoke, and try not to  be around smoke. If you need help quitting, ask your doctor.  Do not have caffeine.  Do not drink alcohol.  Rest as needed. Contact a doctor if:  You have new problems (symptoms).  You cough up yellow fluid (pus).  Your cough does not get better after 2-3 weeks, or your cough gets worse.  Medicine does not help your cough and you are not sleeping well.  You have pain that gets worse or pain that is not helped with medicine.  You have a fever.  You are losing weight and you do not know why.  You have night sweats. Get help right away if:  You cough up blood.  You have trouble breathing.  Your heartbeat is very fast. This information is not intended to replace advice given to you by your health care provider. Make sure you discuss any questions you have with your health care provider. Document Released: 10/24/2010 Document Revised: 07/19/2015 Document Reviewed: 04/19/2014 Elsevier Interactive Patient Education  2019 [employer]  

## 2018-02-11 NOTE — Progress Notes (Signed)
Sydney Sullivan 37 y.o.   Chief Complaint  Patient presents with  . Cough    x 3 days productive with green mucus  . Headache    sorethroat     HISTORY OF PRESENT ILLNESS: This is a 37 y.o. female complaining of productive cough with chest congestion, headache, generalized achiness, that started 5 days ago.  Gradual onset, symptoms peaked after 2 to 3 days, some chills but no fever, slightly nauseous but no vomiting.  No other significant symptoms. Had the flu vaccine last October.  No other family members sick at home.  No recent traveling.  Has a history of asthma and has been using albuterol nebulizers with some success.  Has intermittent wheezing.  Denies difficulty breathing or chest pain.  HPI   Prior to Admission medications   Medication Sig Start Date End Date Taking? Authorizing Provider  albuterol (PROVENTIL) (2.5 MG/3ML) 0.083% nebulizer solution Take 3 mLs (2.5 mg total) by nebulization every 6 (six) hours as needed for wheezing or shortness of breath. 08/02/16  Yes Tereasa Coop, PA-C  cetirizine (ZYRTEC) 10 MG tablet Take 1 tablet (10 mg total) by mouth daily. Do not miss doses. 08/02/16  Yes Tereasa Coop, PA-C  celecoxib (CELEBREX) 100 MG capsule Take 1 capsule (100 mg total) by mouth 2 (two) times daily. Patient not taking: Reported on 02/11/2018 11/12/16   Jaynee Eagles, PA-C  hyoscyamine (LEVSIN SL) 0.125 MG SL tablet Place 1 tablet (0.125 mg total) under the tongue every 6 (six) hours as needed. Patient not taking: Reported on 02/11/2018 06/08/17   Doran Stabler, MD    Allergies  Allergen Reactions  . Aspirin Anaphylaxis    Patient Active Problem List   Diagnosis Date Noted  . Acute abdominal pain 11/30/2015  . Pelvic pain in female 09/15/2013  . Dysmenorrhea 09/15/2013    Past Medical History:  Diagnosis Date  . Abnormal Pap smear   . Allergy   . Ankle fracture, right   . Anxiety   . Asthma   . Depression   . Headache(784.0)   . Heart  palpitations    cardiac workup ok  . Hypoglycemia   . Ovarian cyst   . Tailbone injury    fracture    Past Surgical History:  Procedure Laterality Date  . ABSCESS DRAINAGE Left    leg  . BREAST LUMPECTOMY Left   . CESAREAN SECTION     x 2  . LAPAROSCOPIC OVARIAN CYSTECTOMY     x 3  . TUBAL LIGATION      Social History   Socioeconomic History  . Marital status: Married    Spouse name: Not on file  . Number of children: 2  . Years of education: Not on file  . Highest education level: Not on file  Occupational History  . Not on file  Social Needs  . Financial resource strain: Not on file  . Food insecurity:    Worry: Not on file    Inability: Not on file  . Transportation needs:    Medical: Not on file    Non-medical: Not on file  Tobacco Use  . Smoking status: Never Smoker  . Smokeless tobacco: Never Used  Substance and Sexual Activity  . Alcohol use: No  . Drug use: No  . Sexual activity: Yes    Birth control/protection: Surgical  Lifestyle  . Physical activity:    Days per week: Not on file    Minutes per session: Not  on file  . Stress: Not on file  Relationships  . Social connections:    Talks on phone: Not on file    Gets together: Not on file    Attends religious service: Not on file    Active member of club or organization: Not on file    Attends meetings of clubs or organizations: Not on file    Relationship status: Not on file  . Intimate partner violence:    Fear of current or ex partner: Not on file    Emotionally abused: Not on file    Physically abused: Not on file    Forced sexual activity: Not on file  Other Topics Concern  . Not on file  Social History Narrative  . Not on file    Family History  Problem Relation Age of Onset  . Cholecystitis Mother   . Diabetes Father   . Kidney cancer Father   . Prostate cancer Maternal Uncle        x 2  . Kidney disease Maternal Uncle   . Cancer Maternal Uncle        type unknown  .  Diabetes Maternal Uncle   . Diabetes Paternal Aunt   . Diabetes Paternal Uncle   . Diabetes Maternal Aunt   . Cholecystitis Maternal Aunt      Review of Systems  Constitutional: Positive for chills. Negative for fever.  HENT: Positive for congestion and sore throat. Negative for ear pain.   Eyes: Negative for blurred vision and double vision.  Respiratory: Positive for cough, sputum production and wheezing. Negative for hemoptysis and shortness of breath.   Cardiovascular: Negative.  Negative for chest pain and palpitations.  Gastrointestinal: Negative for abdominal pain, diarrhea, nausea and vomiting.  Genitourinary: Negative.   Musculoskeletal: Positive for myalgias.  Skin: Negative.  Negative for rash.  Neurological: Positive for headaches. Negative for dizziness, sensory change, focal weakness and loss of consciousness.  Endo/Heme/Allergies: Negative.   All other systems reviewed and are negative.     Vitals:   02/11/18 1637  BP: (!) 116/55  Pulse: 85  Resp: 16  Temp: 98.4 F (36.9 C)  SpO2: 100%    Physical Exam Vitals signs reviewed.  Constitutional:      Appearance: She is well-developed.  HENT:     Head: Normocephalic and atraumatic.  Eyes:     Extraocular Movements: Extraocular movements intact.     Pupils: Pupils are equal, round, and reactive to light.  Neck:     Musculoskeletal: Normal range of motion and neck supple.  Cardiovascular:     Rate and Rhythm: Normal rate and regular rhythm.  Pulmonary:     Effort: Pulmonary effort is normal. No respiratory distress.     Breath sounds: Normal breath sounds.  Abdominal:     Palpations: Abdomen is soft.  Musculoskeletal: Normal range of motion.  Lymphadenopathy:     Cervical: No cervical adenopathy.  Skin:    General: Skin is warm and dry.     Capillary Refill: Capillary refill takes less than 2 seconds.  Neurological:     General: No focal deficit present.     Mental Status: She is alert and oriented  to person, place, and time.     Sensory: No sensory deficit.     Motor: No weakness.     Coordination: Coordination normal.  Psychiatric:        Mood and Affect: Mood normal.        Behavior: Behavior normal.  A total of 25 minutes was spent in the room with the patient, greater than 50% of which was in counseling/coordination of care regarding differential diagnosis, treatment, medications, and need for follow-up if no better or worse.   ASSESSMENT & PLAN: Emalea was seen today for cough and headache.  Diagnoses and all orders for this visit:  Cough -     promethazine-codeine (PHENERGAN WITH CODEINE) 6.25-10 MG/5ML syrup; Take 5 mLs by mouth at bedtime as needed for cough.  Lower respiratory infection -     azithromycin (ZITHROMAX) 250 MG tablet; Sig as indicated  History of asthma -     predniSONE (DELTASONE) 20 MG tablet; Take 2 tablets (40 mg total) by mouth daily with breakfast for 5 days.  Chest congestion -     predniSONE (DELTASONE) 20 MG tablet; Take 2 tablets (40 mg total) by mouth daily with breakfast for 5 days. -     pseudoephedrine-guaifenesin (MUCINEX D) 60-600 MG 12 hr tablet; Take 1 tablet by mouth every 12 (twelve) hours for 5 days.    Patient Instructions       If you have lab work done today you will be contacted with your lab results within the next 2 weeks.  If you have not heard from [place] then please contact us. The fastest way to get your results is to register for My Chart.   IF you received an x-ray today, you will receive an invoice from [facility]. Please contact [facility] at [phone] with questions or concerns regarding your invoice.   IF you received labwork today, you will receive an invoice from [facility]. Please contact [facility] at [phone] with questions or concerns regarding your invoice.   Our billing staff will not be able to assist you with questions regarding bills from these companies.  You will be  contacted with the lab results as soon as they are available. The fastest way to get your results is to activate your My Chart account. Instructions are located on the last page of this paperwork. If you have not heard from [place] regarding the results in 2 weeks, please contact this office.     Cough, Adult  A cough helps to clear your throat and lungs. A cough may last only 2-3 weeks (acute), or it may last longer than 8 weeks (chronic). Many different things can cause a cough. A cough may be a sign of an illness or another medical condition. Follow these instructions at home:  Pay attention to any changes in your cough.  Take medicines only as told by your doctor. ? If you were prescribed an antibiotic medicine, take it as told by your doctor. Do not stop taking it even if you start to feel better. ? Talk with your doctor before you try using a cough medicine.  Drink enough fluid to keep your pee (urine) clear or pale yellow.  If the air is dry, use a cold steam vaporizer or humidifier in your home.  Stay away from things that make you cough at work or at home.  If your cough is worse at night, try using extra pillows to raise your head up higher while you sleep.  Do not smoke, and try not to be around smoke. If you need help quitting, ask your doctor.  Do not have caffeine.  Do not drink alcohol.  Rest as needed. Contact a doctor if:  You have new problems (symptoms).  You cough up yellow fluid (pus).  Your cough does  not get better after 2-3 weeks, or your cough gets worse.  Medicine does not help your cough and you are not sleeping well.  You have pain that gets worse or pain that is not helped with medicine.  You have a fever.  You are losing weight and you do not know why.  You have night sweats. Get help right away if:  You cough up blood.  You have trouble breathing.  Your heartbeat is very fast. This information is not intended to replace advice given to  you by your health care provider. Make sure you discuss any questions you have with your health care provider. Document Released: 10/24/2010 Document Revised: 07/19/2015 Document Reviewed: 04/19/2014 Elsevier Interactive Patient Education  2019 Elsevier Inc.      Agustina Caroli, MD [facility]

## 2018-12-27 ENCOUNTER — Other Ambulatory Visit: Payer: Self-pay

## 2018-12-27 DIAGNOSIS — Z20822 Contact with and (suspected) exposure to covid-19: Secondary | ICD-10-CM

## 2018-12-28 LAB — NOVEL CORONAVIRUS, NAA: SARS-CoV-2, NAA: DETECTED — AB

## 2018-12-30 ENCOUNTER — Encounter: Payer: Self-pay | Admitting: *Deleted

## 2019-01-17 ENCOUNTER — Telehealth: Payer: Self-pay | Admitting: *Deleted

## 2019-01-17 ENCOUNTER — Telehealth: Payer: Managed Care, Other (non HMO) | Admitting: Emergency Medicine

## 2019-01-17 ENCOUNTER — Other Ambulatory Visit: Payer: Self-pay

## 2019-01-17 ENCOUNTER — Telehealth: Payer: Self-pay | Admitting: Emergency Medicine

## 2019-01-17 NOTE — Telephone Encounter (Signed)
Called patient for a second time, this time using Interpreter [phone]. The home number was called and automated message, # not connected.

## 2019-01-17 NOTE — Telephone Encounter (Signed)
Called patient to triage for appointment at the preferred number in appointment note. Unable to contact patient at this number, an automated message states call cannot be completed call back later.

## 2019-01-18 ENCOUNTER — Telehealth (INDEPENDENT_AMBULATORY_CARE_PROVIDER_SITE_OTHER): Payer: Managed Care, Other (non HMO) | Admitting: Emergency Medicine

## 2019-01-18 ENCOUNTER — Other Ambulatory Visit: Payer: Self-pay

## 2019-01-18 ENCOUNTER — Ambulatory Visit (INDEPENDENT_AMBULATORY_CARE_PROVIDER_SITE_OTHER): Payer: Managed Care, Other (non HMO)

## 2019-01-18 ENCOUNTER — Ambulatory Visit: Payer: Managed Care, Other (non HMO)

## 2019-01-18 ENCOUNTER — Encounter: Payer: Self-pay | Admitting: Emergency Medicine

## 2019-01-18 VITALS — Ht <= 58 in | Wt 150.0 lb

## 2019-01-18 DIAGNOSIS — U071 COVID-19: Secondary | ICD-10-CM

## 2019-01-18 DIAGNOSIS — R0989 Other specified symptoms and signs involving the circulatory and respiratory systems: Secondary | ICD-10-CM | POA: Diagnosis not present

## 2019-01-18 DIAGNOSIS — Z8709 Personal history of other diseases of the respiratory system: Secondary | ICD-10-CM | POA: Diagnosis not present

## 2019-01-18 NOTE — Progress Notes (Signed)
Telemedicine Encounter- SOAP NOTE Established Patient  This telephone encounter was conducted with the patient's (or proxy's) verbal consent via audio telecommunications: yes/no: Yes Patient was instructed to have this encounter in a suitably private space; and to only have persons present to whom they give permission to participate. In addition, patient identity was confirmed by use of name plus two identifiers (DOB and address).  I discussed the limitations, risks, security and privacy concerns of performing an evaluation and management service by telephone and the availability of in person appointments. I also discussed with the patient that there may be a patient responsible charge related to this service. The patient expressed understanding and agreed to proceed.  I spent a total of TIME; 0 MIN TO 60 MIN: 15 minutes talking with the patient or their proxy.  Chief Complaint  Patient presents with  . chest congestion    test positive for COVID last month, wants chest xray to check tightness in the chest    Subjective   Sydney Sullivan is a 38 y.o. female established patient. Telephone visit today for chest congestion. Develop flulike symptoms on 12/22/2018.  Tested positive for COVID-19 on 12/24/2018.  Past medical history of asthma.  Used medications, rested, and hydrated herself well.  Recovered without complications.  Retested on 01/14/2019 and was negative.  Still gets some chest congestion that comes and goes, worried about pneumonia versus bronchitis, however she does not have any symptoms of either one.  Denies fever or chills, difficulty breathing or wheezing, chest pain, nausea or vomiting, abdominal pain or diarrhea.  HPI   Patient Active Problem List   Diagnosis Date Noted  . History of asthma 02/11/2018  . Dysmenorrhea 09/15/2013    Past Medical History:  Diagnosis Date  . Abnormal Pap smear   . Allergy   . Ankle fracture, right   . Anxiety   . Asthma    . Depression   . Headache(784.0)   . Heart palpitations    cardiac workup ok  . Hypoglycemia   . Ovarian cyst   . Tailbone injury    fracture    Current Outpatient Medications  Medication Sig Dispense Refill  . albuterol (PROVENTIL) (2.5 MG/3ML) 0.083% nebulizer solution Take 3 mLs (2.5 mg total) by nebulization every 6 (six) hours as needed for wheezing or shortness of breath. 150 mL 1  . cetirizine (ZYRTEC) 10 MG tablet Take 1 tablet (10 mg total) by mouth daily. Do not miss doses. 30 tablet 11  . celecoxib (CELEBREX) 100 MG capsule Take 1 capsule (100 mg total) by mouth 2 (two) times daily. (Patient not taking: Reported on 01/18/2019) 30 capsule 1  . hyoscyamine (LEVSIN SL) 0.125 MG SL tablet Place 1 tablet (0.125 mg total) under the tongue every 6 (six) hours as needed. (Patient not taking: Reported on 01/18/2019) 45 tablet 2  . promethazine-codeine (PHENERGAN WITH CODEINE) 6.25-10 MG/5ML syrup Take 5 mLs by mouth at bedtime as needed for cough. (Patient not taking: Reported on 01/18/2019) 120 mL 0   Current Facility-Administered Medications  Medication Dose Route Frequency Provider Last Rate Last Dose  . 0.9 %  sodium chloride infusion  500 mL Intravenous Once Doran Stabler, MD        Allergies  Allergen Reactions  . Aspirin Anaphylaxis    Social History   Socioeconomic History  . Marital status: Married    Spouse name: Not on file  . Number of children: 2  . Years of education: Not  on file  . Highest education level: Not on file  Occupational History  . Not on file  Social Needs  . Financial resource strain: Not on file  . Food insecurity    Worry: Not on file    Inability: Not on file  . Transportation needs    Medical: Not on file    Non-medical: Not on file  Tobacco Use  . Smoking status: Never Smoker  . Smokeless tobacco: Never Used  Substance and Sexual Activity  . Alcohol use: No  . Drug use: No  . Sexual activity: Yes    Birth  control/protection: Surgical  Lifestyle  . Physical activity    Days per week: Not on file    Minutes per session: Not on file  . Stress: Not on file  Relationships  . Social Herbalist on phone: Not on file    Gets together: Not on file    Attends religious service: Not on file    Active member of club or organization: Not on file    Attends meetings of clubs or organizations: Not on file    Relationship status: Not on file  . Intimate partner violence    Fear of current or ex partner: Not on file    Emotionally abused: Not on file    Physically abused: Not on file    Forced sexual activity: Not on file  Other Topics Concern  . Not on file  Social History Narrative  . Not on file    Review of Systems  Constitutional: Negative.  Negative for chills and fever.  HENT: Positive for congestion. Negative for sore throat.   Eyes: Negative.   Respiratory: Negative.  Negative for cough, hemoptysis, sputum production, shortness of breath and wheezing.   Cardiovascular: Negative.  Negative for chest pain and palpitations.  Gastrointestinal: Negative.  Negative for abdominal pain, blood in stool, diarrhea, nausea and vomiting.  Genitourinary: Negative.  Negative for dysuria and hematuria.  Musculoskeletal: Negative.  Negative for back pain, myalgias and neck pain.  Skin: Negative.   Neurological: Negative.  Negative for dizziness and headaches.  Endo/Heme/Allergies: Negative.   All other systems reviewed and are negative.  Dg Chest 2 View  Result Date: 01/18/2019 CLINICAL DATA:  Chest congestion EXAM: CHEST - 2 VIEW COMPARISON:  11/13/2015 FINDINGS: The heart size and mediastinal contours are within normal limits. Both lungs are clear. The visualized skeletal structures are unremarkable. IMPRESSION: No acute cardiopulmonary disease. Electronically Signed   By: Zetta Bills M.D.   On: 01/18/2019 11:01    Objective  Alert and oriented x3 in no apparent respiratory  distress.  No coughing or shortness of breath while talking to me on the phone. Vitals as reported by the patient: Today's Vitals   01/18/19 0841  Weight: 150 lb (68 kg)  Height: 4\' 10"  (1.473 m)    Breck was seen today for chest congestion.  Diagnoses and all orders for this visit:  Chest congestion -     DG Chest 2 View; Future  History of asthma  COVID-19 virus infection Comments: Asymptomatic      I discussed the assessment and treatment plan with the patient. The patient was provided an opportunity to ask questions and all were answered. The patient agreed with the plan and demonstrated an understanding of the instructions.   The patient was advised to call back or seek an in-person evaluation if the symptoms worsen or if the condition fails to improve as  anticipated.  I provided 15 minutes of non-face-to-face time during this encounter.  Horald Pollen, MD  Primary Care at [facility]

## 2019-01-21 ENCOUNTER — Ambulatory Visit: Payer: Self-pay

## 2019-01-21 NOTE — Telephone Encounter (Signed)
Husband called to report that pt. Has had increase in heart rate 103-111, mild shortness of breath, and chest tightness across front of chest.  Reported the chest discomfort also radiates to upper back/ shoulder blade area, and back of neck.  Reported the chest tightness is "mild to moderate".  Stated that pt. was COVID positive on 12-24-18.  Stated she recently retested, and was COVID negative.  Stated pt. Has hx of asthma, and due to increased heart rate, is not sure if she should use the nebulizer treatment.  Had Telemedicine visit and chest xray on 11/24; CXR was negative for active cardiopulmonary disease.    Called Scheduler in office.  No available appts. In office today.    Advised pt's husband that pt. should go to UC today for evaluation, due to current symptoms.  Advised she should not wait for eval. At next available appt. next week.  Husband verb. Understanding and agreed with plan.         Reason for Disposition . [1] Chest pain lasts > 5 minutes AND [2] occurred in past 3 days (72 hours)  Answer Assessment - Initial Assessment Questions 1. LOCATION: "Where does it hurt?"       The "whole front of chest"   2. RADIATION: "Does the pain go anywhere else?" (e.g., into neck, jaw, arms, back)     Upper back shoulder blade area and back of neck  3. ONSET: "When did the chest pain begin?" (Minutes, hours or days)      Thursday along with heart racing  4. PATTERN "Does the pain come and go, or has it been constant since it started?"  "Does it get worse with exertion?"      Constant  5. DURATION: "How long does it last" (e.g., seconds, minutes, hours)     constant 6. SEVERITY: "How bad is the pain?"  (e.g., Scale 1-10; mild, moderate, or severe)    - MILD (1-3): doesn't interfere with normal activities     - MODERATE (4-7): interferes with normal activities or awakens from sleep    - SEVERE (8-10): excruciating pain, unable to do any normal activities       Mild to moderate  7. CARDIAC  RISK FACTORS: "Do you have any history of heart problems or risk factors for heart disease?" (e.g., angina, prior heart attack; diabetes, high blood pressure, high cholesterol, smoker, or strong family history of heart disease)     No hx of heart disease; denied HTN, High cholesterol, smoking, or DM.  Positive family hx of heart disease  8. PULMONARY RISK FACTORS: "Do you have any history of lung disease?"  (e.g., blood clots in lung, asthma, emphysema, birth control pills)     *No Answer* 9. CAUSE: "What do you think is causing the chest pain?"     Recent COVID positive 10. OTHER SYMPTOMS: "Do you have any other symptoms?" (e.g., dizziness, nausea, vomiting, sweating, fever, difficulty breathing, cough)       Shortness of breath, heart racing to 103-111; some dizziness.  11. PREGNANCY: "Is there any chance you are pregnant?" "When was your last menstrual period?"       Husband stated no chance of pregnancy  Protocols used: CHEST PAIN-A-AH

## 2019-06-07 NOTE — Telephone Encounter (Signed)
Error

## 2022-05-07 ENCOUNTER — Encounter: Payer: Self-pay | Admitting: Gastroenterology

## 2022-10-22 ENCOUNTER — Emergency Department (HOSPITAL_COMMUNITY)
Admission: EM | Admit: 2022-10-22 | Discharge: 2022-10-23 | Disposition: A | Discharge status: Home / Self Care | Payer: 59 | Attending: Emergency Medicine | Admitting: Emergency Medicine

## 2022-10-22 ENCOUNTER — Encounter (HOSPITAL_COMMUNITY): Payer: Self-pay | Admitting: Emergency Medicine

## 2022-10-22 ENCOUNTER — Other Ambulatory Visit: Payer: Self-pay

## 2022-10-22 ENCOUNTER — Emergency Department (HOSPITAL_COMMUNITY): Payer: 59

## 2022-10-22 DIAGNOSIS — R079 Chest pain, unspecified: Secondary | ICD-10-CM

## 2022-10-22 DIAGNOSIS — R0781 Pleurodynia: Secondary | ICD-10-CM | POA: Insufficient documentation

## 2022-10-22 LAB — CBC
HCT: 43.8 % (ref 36.0–46.0)
Hemoglobin: 13.9 g/dL (ref 12.0–15.0)
MCH: 28 pg (ref 26.0–34.0)
MCHC: 31.7 g/dL (ref 30.0–36.0)
MCV: 88.3 fL (ref 80.0–100.0)
Platelets: 266 10*3/uL (ref 150–400)
RBC: 4.96 MIL/uL (ref 3.87–5.11)
RDW: 12.9 % (ref 11.5–15.5)
WBC: 7 10*3/uL (ref 4.0–10.5)
nRBC: 0 % (ref 0.0–0.2)

## 2022-10-22 NOTE — ED Triage Notes (Signed)
Pt with left sided chest pain all day today. Worsening just PTA.  Left arm hurts to raise it.  Also endorses new onset of dizziness PTA.  No n/v/d or shob.

## 2022-10-23 ENCOUNTER — Emergency Department (HOSPITAL_COMMUNITY): Payer: 59

## 2022-10-23 LAB — BASIC METABOLIC PANEL
Anion gap: 10 (ref 5–15)
BUN: 17 mg/dL (ref 6–20)
CO2: 24 mmol/L (ref 22–32)
Calcium: 9 mg/dL (ref 8.9–10.3)
Chloride: 100 mmol/L (ref 98–111)
Creatinine, Ser: 0.64 mg/dL (ref 0.44–1.00)
GFR, Estimated: >60 mL/min (ref >60)
Glucose, Bld: 181 mg/dL — ABNORMAL HIGH (ref 70–99)
Potassium: 3.9 mmol/L (ref 3.5–5.1)
Sodium: 134 mmol/L — ABNORMAL LOW (ref 135–145)

## 2022-10-23 LAB — TROPONIN I (HIGH SENSITIVITY)
Troponin I (High Sensitivity): 3 ng/L
Troponin I (High Sensitivity): 3 ng/L (ref <18)

## 2022-10-23 LAB — HCG, SERUM, QUALITATIVE: Preg, Serum: NEGATIVE

## 2022-10-23 MED ORDER — KETOROLAC TROMETHAMINE 30 MG/ML IJ SOLN: 15.0000 mg | Freq: Once | INTRAMUSCULAR | Status: DC

## 2022-10-23 MED ORDER — DIPHENHYDRAMINE HCL 50 MG/ML IJ SOLN: 25.0000 mg | Freq: Once | INTRAMUSCULAR | Status: DC

## 2022-10-23 MED ORDER — FENTANYL CITRATE PF 50 MCG/ML IJ SOSY: 50.0000 ug | PREFILLED_SYRINGE | Freq: Once | INTRAMUSCULAR | Status: DC

## 2022-10-23 MED ORDER — IOHEXOL 350 MG/ML SOLN
100.0000 mL | Freq: Once | INTRAVENOUS | Status: AC | PRN
  Administered 2022-10-23: 100 mL via INTRAVENOUS

## 2022-10-23 NOTE — Discharge Instructions (Signed)
Return for any problem.  ?

## 2022-10-23 NOTE — ED Notes (Signed)
RN took pt to CT.

## 2022-10-23 NOTE — ED Provider Notes (Signed)
Seen after prior EDP (Dr. Clayborne Dana).   Patient now feels improved.  ED workup is without evidence of a significant acute pathology.  Specifically EKG is without evidence of acute ischemia.  Troponin x 2 is without elevation or significant delta.  Additionally, CTA obtained is without evidence of significant acute abnormality such as PE.  Patient reassured by workup.  She desires discharge home.  Importance of close outpatient follow-up stressed.  Strict return precautions given and understood.   Wynetta Fines, MD 10/23/22 [phone]

## 2022-11-04 NOTE — ED Provider Notes (Signed)
[place] EMERGENCY DEPARTMENT AT [facility] Provider Note   CSN: [id number] Arrival date & time: 10/22/22  2310     History  Chief Complaint  Patient presents with   Chest Pain    Sydney Sullivan is a 42 y.o. female.  Here with L sided chest pain and and some pleuritic component as well. No other associated symptoms. Seems worse with lifting her arm but not worse with flexing pec or palpation. No trauma. No recent illnesses. No cough. No hemoptysis. No fever.    Chest Pain      Home Medications Prior to Admission medications   Medication Sig Start Date End Date Taking? Authorizing Provider  albuterol (PROVENTIL) (2.5 MG/3ML) 0.083% nebulizer solution Take 3 mLs (2.5 mg total) by nebulization every 6 (six) hours as needed for wheezing or shortness of breath. 08/02/16   Ofilia Neas, PA-C  celecoxib (CELEBREX) 100 MG capsule Take 1 capsule (100 mg total) by mouth 2 (two) times daily. Patient not taking: Reported on 01/18/2019 11/12/16   Wallis Bamberg, PA-C  cetirizine (ZYRTEC) 10 MG tablet Take 1 tablet (10 mg total) by mouth daily. Do not miss doses. 08/02/16   Ofilia Neas, PA-C  hyoscyamine (LEVSIN SL) 0.125 MG SL tablet Place 1 tablet (0.125 mg total) under the tongue every 6 (six) hours as needed. Patient not taking: Reported on 01/18/2019 06/08/17   Sherrilyn Rist, MD  promethazine-codeine [facility] WITH CODEINE) 6.25-10 MG/5ML syrup Take 5 mLs by mouth at bedtime as needed for cough. Patient not taking: Reported on 01/18/2019 02/11/18   Georgina Quint, MD      Allergies    Aspirin    Review of Systems   Review of Systems  Cardiovascular:  Positive for chest pain.    Physical Exam Updated Vital Signs BP (!) 140/92 (BP Location: Left Arm)   Pulse 71   Temp 97.9 F (36.6 C) (Oral)   Resp 18   SpO2 100%  Physical Exam Vitals and nursing note reviewed.  Constitutional:      Appearance: She is well-developed.  HENT:     Head:  Normocephalic and atraumatic.  Cardiovascular:     Rate and Rhythm: Normal rate and regular rhythm.  Pulmonary:     Effort: No respiratory distress.     Breath sounds: No stridor.  Abdominal:     General: There is no distension.  Musculoskeletal:     Cervical back: Normal range of motion.  Neurological:     Mental Status: She is alert.     ED Results / Procedures / Treatments   Labs (all labs ordered are listed, but only abnormal results are displayed) Labs Reviewed  BASIC METABOLIC PANEL - Abnormal; Notable for the following components:      Result Value   Sodium 134 (*)    Glucose, Bld 181 (*)    All other components within normal limits  CBC  HCG, SERUM, QUALITATIVE  TROPONIN I (HIGH SENSITIVITY)  TROPONIN I (HIGH SENSITIVITY)    EKG EKG Interpretation Date/Time:  Wednesday October 22 2022 23:31:33 EDT Ventricular Rate:  77 PR Interval:  154 QRS Duration:  72 QT Interval:  378 QTC Calculation: 427 R Axis:   94  Text Interpretation: Normal sinus rhythm Rightward axis Nonspecific T wave abnormality Abnormal ECG When compared with ECG of 13-Nov-2015 17:15, PREVIOUS ECG IS PRESENT similar to previous Confirmed by Marily Memos [id number]) on 10/23/2022 4:16:40 AM  Radiology No results found.  Procedures Procedures  Medications Ordered in ED Medications  iohexol (OMNIPAQUE) 350 MG/ML injection 100 mL (100 mLs Intravenous Contrast Given 10/23/22 0746)    ED Course/ Medical Decision Making/ A&P                                 Medical Decision Making Amount and/or Complexity of Data Reviewed Labs: ordered. Radiology: ordered.  Risk Prescription drug management.   Will rule out for PE/ACS/pneumonia.  First troponin and ecg reassuring. Pain somewhat improved. Care transferred pendign PE study and second troponin.   Final Clinical Impression(s) / ED Diagnoses Final diagnoses:  Chest pain, unspecified type    Rx / DC Orders ED Discharge Orders     None          Cato Liburd, Barbara Cower, MD 11/04/22 [phone]
